# Patient Record
Sex: Female | Born: 1945 | Race: White | Hispanic: No | Marital: Married | Smoking: Never smoker
Health system: Southern US, Community
[De-identification: ages and names within clinical notes are randomized; demographics above are authoritative.]

## PROBLEM LIST (undated history)

## (undated) DIAGNOSIS — K449 Diaphragmatic hernia without obstruction or gangrene: Secondary | ICD-10-CM

## (undated) DIAGNOSIS — K648 Other hemorrhoids: Secondary | ICD-10-CM

## (undated) DIAGNOSIS — R42 Dizziness and giddiness: Secondary | ICD-10-CM

## (undated) DIAGNOSIS — D369 Benign neoplasm, unspecified site: Secondary | ICD-10-CM

## (undated) DIAGNOSIS — K644 Residual hemorrhoidal skin tags: Secondary | ICD-10-CM

## (undated) DIAGNOSIS — M1990 Unspecified osteoarthritis, unspecified site: Secondary | ICD-10-CM

## (undated) DIAGNOSIS — IMO0001 Reserved for inherently not codable concepts without codable children: Secondary | ICD-10-CM

## (undated) DIAGNOSIS — D649 Anemia, unspecified: Secondary | ICD-10-CM

## (undated) DIAGNOSIS — G2581 Restless legs syndrome: Secondary | ICD-10-CM

## (undated) DIAGNOSIS — K219 Gastro-esophageal reflux disease without esophagitis: Secondary | ICD-10-CM

## (undated) DIAGNOSIS — K7689 Other specified diseases of liver: Secondary | ICD-10-CM

## (undated) DIAGNOSIS — G43909 Migraine, unspecified, not intractable, without status migrainosus: Secondary | ICD-10-CM

## (undated) DIAGNOSIS — K55039 Acute (reversible) ischemia of large intestine, extent unspecified: Secondary | ICD-10-CM

## (undated) DIAGNOSIS — E7800 Pure hypercholesterolemia, unspecified: Secondary | ICD-10-CM

## (undated) HISTORY — DX: Acute (reversible) ischemia of large intestine, extent unspecified: K55.039

## (undated) HISTORY — PX: CATARACT EXTRACTION W/ INTRAOCULAR LENS IMPLANT: SHX1309

## (undated) HISTORY — DX: Other specified diseases of liver: K76.89

## (undated) HISTORY — DX: Gastro-esophageal reflux disease without esophagitis: K21.9

## (undated) HISTORY — PX: ABDOMINAL HYSTERECTOMY: SHX81

## (undated) HISTORY — DX: Migraine, unspecified, not intractable, without status migrainosus: G43.909

## (undated) HISTORY — DX: Other hemorrhoids: K64.8

## (undated) HISTORY — PX: OTHER SURGICAL HISTORY: SHX169

## (undated) HISTORY — DX: Diaphragmatic hernia without obstruction or gangrene: K44.9

## (undated) HISTORY — DX: Benign neoplasm, unspecified site: D36.9

## (undated) HISTORY — DX: Residual hemorrhoidal skin tags: K64.4

## (undated) HISTORY — PX: COLONOSCOPY W/ BIOPSIES: SHX1374

## (undated) HISTORY — DX: Pure hypercholesterolemia, unspecified: E78.00

## (undated) HISTORY — DX: Dizziness and giddiness: R42

---

## 1991-12-13 HISTORY — PX: TOTAL ABDOMINAL HYSTERECTOMY W/ BILATERAL SALPINGOOPHORECTOMY: SHX83

## 1998-12-12 HISTORY — PX: KNEE ARTHROSCOPY: SHX127

## 1999-02-25 ENCOUNTER — Encounter (INDEPENDENT_AMBULATORY_CARE_PROVIDER_SITE_OTHER): Payer: Self-pay | Admitting: Internal Medicine

## 1999-02-25 ENCOUNTER — Ambulatory Visit (HOSPITAL_COMMUNITY): Admission: RE | Admit: 1999-02-25 | Discharge: 1999-02-25 | Payer: Self-pay | Admitting: Internal Medicine

## 2003-11-10 ENCOUNTER — Other Ambulatory Visit: Admission: RE | Admit: 2003-11-10 | Discharge: 2003-11-10 | Payer: Self-pay | Admitting: Gynecology

## 2005-11-14 ENCOUNTER — Ambulatory Visit (HOSPITAL_BASED_OUTPATIENT_CLINIC_OR_DEPARTMENT_OTHER): Admission: RE | Admit: 2005-11-14 | Discharge: 2005-11-14 | Payer: Self-pay | Admitting: Specialist

## 2005-11-14 ENCOUNTER — Ambulatory Visit (HOSPITAL_COMMUNITY): Admission: RE | Admit: 2005-11-14 | Discharge: 2005-11-14 | Payer: Self-pay | Admitting: Specialist

## 2008-03-26 ENCOUNTER — Ambulatory Visit: Payer: Self-pay | Admitting: Internal Medicine

## 2008-04-01 ENCOUNTER — Encounter: Payer: Self-pay | Admitting: Internal Medicine

## 2008-04-01 ENCOUNTER — Ambulatory Visit: Payer: Self-pay | Admitting: Internal Medicine

## 2011-04-29 NOTE — Op Note (Signed)
NAMEGRAZIA, TAFFE                ACCOUNT NO.:  [id number]   MEDICAL RECORD NO.:  [id number]          PATIENT TYPE:  AMB   LOCATION:  [facility]                         FACILITY:  [facility]   PHYSICIAN:  Jene Every, M.D.    DATE OF BIRTH:  01-06-1946   DATE OF PROCEDURE:  11/14/2005  DATE OF DISCHARGE:                                 OPERATIVE REPORT   PREOPERATIVE DIAGNOSIS:  Lateral meniscal tear, left knee.   POSTOPERATIVE DIAGNOSIS:  Lateral meniscal tear, left knee, patellofemoral  degenerative joint disease, tricompartmental osteoarthrosis, grade III  chondromalacia of the tibia.   PROCEDURE PERFORMED:  Left knee arthroscopy, chondroplasty, lateral femoral  condyle, lateral tibial plateau, patella, partial lateral meniscectomy,  evacuation of loose bodies.   ANESTHESIA:  General.   ASSISTANT:  None.   BRIEF HISTORY/INDICATIONS:  A 65 year old with lateral joint pain,  occasional giving away and locking of the knee.  Refractory to conservative  treatment, including corticosteroid injection, rest, activity modification,  MRI indicating an oblique tear to the anterior torn, lateral meniscus,  moderate osteoarthrosis of the lateral and the posterior femoral  compartments.  Joint effusion was noted.  Operative intervention was  indicated for diagnosis and debridement, evacuation of suspected loose  bodies, meniscus tear, chondral flap tear.  Risks and benefits were  discussed, including bleeding, infection, damage to vascular structures, no  change in symptoms, worsening symptoms, need for repeat debridement and for  total knee arthroplasty in the future, etc.   TECHNIQUE:  With the patient in supine position, after the induction of  adequate general anesthesia and 1 gm of Kefzol, the left lower extremity was  prepped and draped in the usual sterile fashion.  A lateral parapatellar  portal and superomedial parapatellar portal was fashioned with a #11 blade.  The ingress cannula  was atraumatically placed.  Irrigant was utilized to  insufflate the joint.  Under direct visualization, the medial parapatellar  portal was fashioned with a #11 blade after a localization with an 18 gauge  needle, sparing the medial meniscus.  Noted was extensive degenerative  changes of the patellofemoral joint as well as the medial compartment.  There was degenerative tearing of the meniscus, most on the anterior horn.  There was delamination or grade 3 chondromalacia of the femoral condyle but  mostly the tibial plateau.  There are some grade 4 lesions posteriorly as  well as on the patella and lateral facet.  I introduced a 4.2 coude shaver  and utilized to evacuate the loose bodies, performed a chondroplasty of the  tibial plateau, femoral condyle, partial lateral meniscectomy, particularly  the anterior horn.  Also, the posterior horn.  The remainder was stable to  probe palpation.  On the cephalad and caudad edge of it, I probed it  extensively with a probe.  Nothing subluxed into the joint or with flexion  and extension caused any impingement.  The patellofemoral joint revealed  good space for the patella.  There was normal tracking into the patella with  flexion and extension.  There was significant patellofemoral arthrosis  noted.  There was osteophytic  ridging over the lateral femoral condyle and  the medial femoral condyle.  Some degenerative fraying of the ACL was noted  as well.  I performed a chondroplasty of the patella and femoral sulcus.  Examined the gutters.  There was loose cartilaginous debris noted.  This was  evacuated.  No meniscus tear noted here.  The medial compartment revealed  some degenerative changes.  I gently debrided the medial compartment.  The  meniscus was stable to probe palpation without evidence of tear. The femoral  condyle showed some grade II and grade III changes.  Debridement was  performed here.  No loose bodies.  The medial gutter was  unremarkable.  I  __________ some __________ synovitis and some degenerative fraying of the  mid portion of the notch.  Copiously lavaged the knee.  PCL was not  visualized.  Again examined down the lateral compartment where the majority  of the symptoms were.  There was no residual chondral flap tear or meniscus  tear that was unstable.  Again, extensive degenerative changes were noted,  particularly on the tibial plateau.  Copiously lavaged and reprobed the  meniscus.  No residual pathology amenable to surgical intervention.  Reprobed it again.  It was not subluxing.  The remainder of the meniscus was  stable to probe palpation.  Next, the knee was copiously lavaged.  Portals  were closed with 4-0 nylon simple sutures.  Flexed and extended the knee.  There was good tracking of the patella.  Performed a McMurray.  Varus and  valgus stress was 0-30 degrees.  I elicited no blocks to range of motion.  No subluxation of the patella.  I felt it was without mechanical symptoms at  its conclusion.  The portals had been closed with 4-0 nylon simple suture.  I placed a 0.25% Marcaine with epinephrine and infiltrated the joint.  The  wound was dressed sterilely with 4x4s and Ace.  She was then awakened  without difficulty and transported to the recovery room in satisfactory  condition.   Patient tolerated the procedure well with no complications.      Jene Every, M.D.  Electronically Signed     JB/MEDQ  D:  11/14/2005  T:  11/14/2005  Job:  [id number]

## 2011-05-13 DIAGNOSIS — K55039 Acute (reversible) ischemia of large intestine, extent unspecified: Secondary | ICD-10-CM

## 2011-05-13 HISTORY — DX: Acute (reversible) ischemia of large intestine, extent unspecified: K55.039

## 2011-07-12 ENCOUNTER — Telehealth: Payer: Self-pay | Admitting: Internal Medicine

## 2011-07-12 NOTE — Telephone Encounter (Signed)
Forwarded to Dr. Gessner for Review. °

## 2011-07-20 ENCOUNTER — Encounter: Payer: Self-pay | Admitting: Internal Medicine

## 2011-07-20 ENCOUNTER — Telehealth: Payer: Self-pay | Admitting: Gastroenterology

## 2011-07-20 NOTE — Telephone Encounter (Signed)
Message copied by Bernita Buffy on Wed Jul 20, 2011  2:09 PM ------      Message from: Iva Boop      Created: Tue Jul 19, 2011  9:39 PM      Regarding: appt/       i have records from recent hospitalization that indicate she is to have an appointment with [place] but she does not have an appointment that i can tell            It is ok for her to schedule one.

## 2011-07-20 NOTE — Telephone Encounter (Signed)
Appointment scheduled.

## 2011-08-23 ENCOUNTER — Encounter: Payer: Self-pay | Admitting: Internal Medicine

## 2011-08-23 ENCOUNTER — Ambulatory Visit (INDEPENDENT_AMBULATORY_CARE_PROVIDER_SITE_OTHER): Payer: Medicare Other | Admitting: Internal Medicine

## 2011-08-23 VITALS — BP 122/80 | HR 60 | Ht 67.0 in | Wt 157.0 lb

## 2011-08-23 DIAGNOSIS — Z860100 Personal history of colon polyps, unspecified: Secondary | ICD-10-CM | POA: Insufficient documentation

## 2011-08-23 DIAGNOSIS — K55059 Acute (reversible) ischemia of intestine, part and extent unspecified: Secondary | ICD-10-CM

## 2011-08-23 DIAGNOSIS — K55039 Acute (reversible) ischemia of large intestine, extent unspecified: Secondary | ICD-10-CM | POA: Insufficient documentation

## 2011-08-23 NOTE — Progress Notes (Signed)
  Subjective:    Patient ID: Beth Suarez, female    DOB: 26-Jun-1946, 65 y.o.   MRN: [id number]  HPI Very pleasant 65 year old white woman that was admitted to [facility] in June. CT scan colonoscopy and biopsies showed that she had left-sided ischemic colitis. At that time she has returned to normal and has no complaints. She is here for followup to see if anything further needs to be done. I know her from a routine screening colonoscopy in 2009, she had 2 small adenomas removed.   Review of Systems Intermittent migraine headaches Otherwise negative    Objective:   Physical Exam General: WDWN NAD Eyes: anicteric Lungs: clear Heart: S1S2 no rubs, murmurs or gallops Abdomen: soft and nontender, BS+ Ext: no edema          Assessment & Plan:

## 2011-08-23 NOTE — Patient Instructions (Signed)
We will plan to see you for a routine Colonoscopy approximately April 2014.

## 2011-08-23 NOTE — Assessment & Plan Note (Signed)
She appears recovered. I explained the physiology and  causes of ischemic colitis. She does not appear to have any risk factors for that to him aware of. The major mesenteric vessels were patent on CT scan. We'll observe for any recurrent problems, would not do any further workup at this point. She had just a trivial anemia with hemoglobin 11.9. Can followup with Dr. Sherril Croon on that.

## 2011-08-23 NOTE — Assessment & Plan Note (Signed)
Anticipate routine repeat colonoscopy in April 2014. The colonoscopy she had this chair and is not an adequate screening and surveillance exam due to the colitis.

## 2013-03-28 ENCOUNTER — Encounter: Payer: Self-pay | Admitting: Internal Medicine

## 2013-04-03 ENCOUNTER — Telehealth: Payer: Self-pay | Admitting: Internal Medicine

## 2013-04-04 NOTE — Telephone Encounter (Signed)
I have left a detailed message for the patient that Dr. Leone Payor has reviewed her chart and she is infact due for a colonoscopy.  I have asked her to call back if she has any additional questions or concerns to me or call back to schedule.

## 2013-10-15 ENCOUNTER — Encounter: Payer: Self-pay | Admitting: Internal Medicine

## 2015-03-12 ENCOUNTER — Encounter: Payer: Self-pay | Admitting: Internal Medicine

## 2015-04-21 ENCOUNTER — Encounter: Payer: Self-pay | Admitting: *Deleted

## 2015-04-22 ENCOUNTER — Encounter: Payer: Self-pay | Admitting: *Deleted

## 2015-04-22 ENCOUNTER — Ambulatory Visit (INDEPENDENT_AMBULATORY_CARE_PROVIDER_SITE_OTHER): Payer: Medicare Other | Admitting: Cardiology

## 2015-04-22 ENCOUNTER — Encounter: Payer: Self-pay | Admitting: Cardiology

## 2015-04-22 VITALS — BP 113/76 | HR 65 | Ht 67.0 in | Wt 165.0 lb

## 2015-04-22 DIAGNOSIS — E782 Mixed hyperlipidemia: Secondary | ICD-10-CM

## 2015-04-22 DIAGNOSIS — R079 Chest pain, unspecified: Secondary | ICD-10-CM

## 2015-04-22 DIAGNOSIS — R0602 Shortness of breath: Secondary | ICD-10-CM

## 2015-04-22 DIAGNOSIS — D509 Iron deficiency anemia, unspecified: Secondary | ICD-10-CM

## 2015-04-22 NOTE — Patient Instructions (Signed)
Your physician recommends that you continue on your current medications as directed. Please refer to the Current Medication list given to you today. Your physician has requested that you have en exercise stress myoview. For further information please visit [email or web address]. Please follow instruction sheet, as given.  We will call you with your results. 

## 2015-04-22 NOTE — Progress Notes (Signed)
Cardiology Office Note  Date: 04/22/2015   ID: Beth Suarez, DOB October 02, 1946, MRN [id number]  PCP: Glenda Chroman., MD  Primary Cardiologist: Rozann Lesches, MD   Chief Complaint  Patient presents with  . Chest Pain    History of Present Illness: Beth Suarez is a 69 y.o. female referred for cardiology consultation by Dr. Woody Seller. I reviewed her records and discussed her symptoms. She states that back in March she had a dark tarry stool, only on one occasion. After this she indicates experiencing exertional chest tightness concerning for angina. It occurred at one point when she was out picking up sticks in her yard, and again when she went to the track to walk. Usually she walked a mile with her husband, but was only able to go one lap that particular day due to chest tightness. She was admitted to [facility] by Dr. Woody Seller and was found to be anemic with a hemoglobin as low as 7.7, iron level of 20, iron saturation of only 4%. ECG showed sinus rhythm with nonspecific T-wave changes. Troponin I levels were normal and chest x-ray reported no acute findings.   She tells me that she was treated with iron infusions, is now on an oral iron supplement. Most recent follow-up hemoglobin was up to 10.9 in late April as shown below. She awaits gastroenterology consultation with Dr. Carlean Purl (seen by him in 2012).  Beth Suarez reports that the episodes of chest tightness have resolved, however she still experiences exertional fatigue and more shortness of breath with activity than she is used to. She does not endorse any prior history of ischemic heart disease or cardiomyopathy. Past medical history reviewed including hyperlipidemia and reportedly ischemic colitis back in 2012.   Past Medical History  Diagnosis Date  . Vertigo   . GERD (gastroesophageal reflux disease)   . Hypercholesteremia   . Migraine headache   . Adenomatous polyps     Colonoscopy 03/2008  . Internal and external hemorrhoids  without complication   . Liver cyst   . Hiatal hernia   . Acute ischemic colitis 05/2011    [facility]    Past Surgical History  Procedure Laterality Date  . Knee arthroscopy Left   . Total abdominal hysterectomy w/ bilateral salpingoophorectomy  1993  . Colonoscopy w/ biopsies  03/2008, 05/2011    Adenomous polyps, internal and external hemorrhoids, colitis    Current Outpatient Prescriptions  Medication Sig Dispense Refill  . Ascorbic Acid (VITAMIN C) 1000 MG tablet Take 1,000 mg by mouth 2 (two) times daily.    [name] Kitchen atorvastatin (LIPITOR) 10 MG tablet Take 1 tablet by mouth daily.  6  . Biotin 10 MG TABS Take 1 tablet by mouth daily.    . Calcium-Vitamin D 600-125 MG-UNIT TABS Take 1 tablet by mouth 2 (two) times daily.     . Cholecalciferol (VITAMIN D3) 5000 UNITS CAPS Take 1 capsule by mouth daily.    . clonazePAM (KLONOPIN) 0.5 MG tablet Take 1 tablet by mouth daily as needed.  1  . Ferrous Gluconate (IRON) 240 (27 FE) MG TABS Take 1 tablet by mouth daily.    . Multiple Vitamin (MULTIVITAMIN) tablet Take 1 tablet by mouth daily.    . Omega-3 Fatty Acids (FISH OIL) 1000 MG CAPS Take 1 capsule by mouth daily.       No current facility-administered medications for this visit.    Allergies:  Codeine   Social History: The patient  reports that she has  never smoked. She has never used smokeless tobacco. She reports that she does not drink alcohol or use illicit drugs.   Family History: The patient's family history includes Diabetes in her mother; Heart attack (age of onset: 93) in her father; Heart disease in her father; Irritable bowel syndrome in her daughter; Kidney disease in her mother.   ROS:  Please see the history of present illness. Otherwise, complete review of systems is positive for exertional fatigue and shortness of breath. No recurring melena or hematochezia. All other systems are reviewed and negative.   Physical Exam: VS:  BP 113/76 mmHg  Pulse 65  Ht 5\' 7"   (1.702 m)  Wt 165 lb (74.844 kg)  BMI 25.84 kg/m2, BMI Body mass index is 25.84 kg/(m^2).  Wt Readings from Last 3 Encounters:  04/22/15 165 lb (74.844 kg)  08/23/11 157 lb (71.215 kg)     General: Patient appears comfortable at rest. HEENT: Conjunctiva and lids normal, oropharynx clear with moist mucosa. Neck: Supple, no elevated JVP or carotid bruits, no thyromegaly. Lungs: Clear to auscultation, nonlabored breathing at rest. Cardiac: Regular rate and rhythm, no S3 or significant systolic murmur, no pericardial rub. Abdomen: Soft, nontender, bowel sounds present, no guarding or rebound. Extremities: No pitting edema, distal pulses 2+. Skin: Warm and dry. Musculoskeletal: No kyphosis. Neuropsychiatric: Alert and oriented x3, affect grossly appropriate.   ECG: Tracing from 03/04/2015 reviewed showing sinus rhythm with nonspecific T-wave changes.   Recent Labwork:  04/06/2015: Hemoglobin 10.9, platelets 237, iron saturation 38%   Assessment and Plan:  1. Exertional fatigue and shortness of breath, prior to this exertional chest tightness consistent with angina. As described above, this was noted in these setting of significant iron deficiency anemia diagnosed in March at [facility] by Dr. Woody Seller. Hemoglobin has improved from a low of 7.7 up to 10.9, she still experiences exertional fatigue and shortness of breath. Gastroenterology evaluation is pending with Dr. Carlean Purl. From a cardiac perspective, we will proceed with an exercise Cardiolite to assess ischemic burden, exclude underlying obstructive CAD that was perhaps unmasked by anemia.  2. Hyperlipidemia, on omega-3 supplements and Lipitor, followed by Dr. Woody Seller.  3. Acute ischemic colitis 2012.  Current medicines were reviewed with the patient today.   Orders Placed This Encounter  Procedures  . NM Myocar Multi W/Spect W/Wall Motion / EF  . Myocardial Perfusion Imaging    Disposition: Call with results.   Signed, Satira Sark, MD, [facility] 04/22/2015 11:14 AM    [facility] [address], [place], [place] [address] Phone: [phone]; Fax: [phone]

## 2015-05-01 ENCOUNTER — Encounter (HOSPITAL_COMMUNITY): Payer: Self-pay

## 2015-05-01 ENCOUNTER — Encounter (HOSPITAL_COMMUNITY)
Admission: RE | Admit: 2015-05-01 | Discharge: 2015-05-01 | Disposition: A | Discharge status: Home / Self Care | Payer: Medicare Other | Source: Ambulatory Visit | Attending: Cardiology | Admitting: Cardiology

## 2015-05-01 ENCOUNTER — Inpatient Hospital Stay (HOSPITAL_COMMUNITY): Admission: RE | Admit: 2015-05-01 | Payer: Medicare Other | Source: Ambulatory Visit

## 2015-05-01 DIAGNOSIS — R079 Chest pain, unspecified: Secondary | ICD-10-CM | POA: Insufficient documentation

## 2015-05-01 DIAGNOSIS — R0602 Shortness of breath: Secondary | ICD-10-CM

## 2015-05-01 MED ORDER — SODIUM CHLORIDE 0.9 % IJ SOLN
INTRAMUSCULAR | Status: AC
  Administered 2015-05-01: 10 mL
  Filled 2015-05-01: qty 3

## 2015-05-01 MED ORDER — REGADENOSON 0.4 MG/5ML IV SOLN
INTRAVENOUS | Status: AC
  Filled 2015-05-01: qty 5

## 2015-05-01 MED ORDER — TECHNETIUM TC 99M SESTAMIBI - CARDIOLITE: 30.0000 | Freq: Once | INTRAVENOUS | Status: AC | PRN

## 2015-05-01 MED ORDER — TECHNETIUM TC 99M SESTAMIBI GENERIC - CARDIOLITE: 10.0000 | Freq: Once | INTRAVENOUS | Status: AC | PRN

## 2015-05-04 ENCOUNTER — Encounter: Payer: Self-pay | Admitting: Internal Medicine

## 2015-05-04 ENCOUNTER — Ambulatory Visit (INDEPENDENT_AMBULATORY_CARE_PROVIDER_SITE_OTHER): Payer: Medicare Other | Admitting: Internal Medicine

## 2015-05-04 VITALS — BP 108/70 | HR 76 | Ht 65.5 in | Wt 167.4 lb

## 2015-05-04 DIAGNOSIS — K449 Diaphragmatic hernia without obstruction or gangrene: Secondary | ICD-10-CM

## 2015-05-04 DIAGNOSIS — R1310 Dysphagia, unspecified: Secondary | ICD-10-CM | POA: Diagnosis not present

## 2015-05-04 DIAGNOSIS — D509 Iron deficiency anemia, unspecified: Secondary | ICD-10-CM | POA: Insufficient documentation

## 2015-05-04 NOTE — Progress Notes (Signed)
Subjective:    Patient ID: Beth Suarez, female    DOB: 08/02/1946, 69 y.o.   MRN: [id number] Chief complaint: Iron deficiency anemia HPI The patient is here for evaluation, she is a 69 year old white woman with a history of ischemic colitis and Adenomatous polyps in 2012. She was recently having dyspnea on exertion and fatigue and was found to be iron deficient with a hemoglobin of 8.1 and a ferritin of 20. She has been treated with iron infusions 2. There is been no sign of bleeding. She feels much better and her hemoglobin and iron studies have normalized. She remains on oral ferrous sulfate. GI systems turns up some intermittent dysphagia. Review of chart shows that showed a large hiatal hernia noted on a previous abdominal pelvic CT. Her GI review of systems is otherwise negative. She does have some intermittent chest pain issues. She has seen cardiology. The chest pain was exertional and probably associated with her anemia has a hemoglobin was around 8 at that time.  She would like to get an evaluation as soon as possible, she is not worried but just wants to get out of the way. She is taking her grandchildren to the [place] the week of June 12th. Allergies  Allergen Reactions  . Codeine     Headache   Outpatient Prescriptions Prior to Visit  Medication Sig Dispense Refill  . Ascorbic Acid (VITAMIN C) 1000 MG tablet Take 1,000 mg by mouth 2 (two) times daily.    [name] Kitchen atorvastatin (LIPITOR) 10 MG tablet Take 1 tablet by mouth daily.  6  . Biotin 10 MG TABS Take 1 tablet by mouth daily.    . Calcium-Vitamin D 600-125 MG-UNIT TABS Take 1 tablet by mouth 2 (two) times daily.     . Cholecalciferol (VITAMIN D3) 5000 UNITS CAPS Take 1 capsule by mouth daily.    . clonazePAM (KLONOPIN) 0.5 MG tablet Take 1 tablet by mouth daily as needed.  1  . Ferrous Gluconate (IRON) 240 (27 FE) MG TABS Take 1 tablet by mouth daily.    . Multiple Vitamin (MULTIVITAMIN) tablet Take 1 tablet by mouth daily.      . Omega-3 Fatty Acids (FISH OIL) 1000 MG CAPS Take 1 capsule by mouth daily.       No facility-administered medications prior to visit.   Past Medical History  Diagnosis Date  . Vertigo   . GERD (gastroesophageal reflux disease)   . Hypercholesteremia   . Migraine headache   . Adenomatous polyps     Colonoscopy 03/2008  . Internal and external hemorrhoids without complication   . Liver cyst   . Hiatal hernia   . Acute ischemic colitis 05/2011    [facility]   Past Surgical History  Procedure Laterality Date  . Knee arthroscopy Left 2000  . Total abdominal hysterectomy w/ bilateral salpingoophorectomy  1993  . Colonoscopy w/ biopsies  03/2008, 05/2011    Adenomous polyps, internal and external hemorrhoids, colitis  . Sebaceous cyst removeal Right      face   History   Social History  . Marital Status: Married    Spouse Name: N/A  . Number of Children: 2  . Years of Education: N/A   Occupational History  . Retired    Social History Main Topics  . Smoking status: Never Smoker   . Smokeless tobacco: Never Used  . Alcohol Use: No  . Drug Use: No  . Sexual Activity: Not on file   Other Topics  Concern  . None   Social History Narrative   Married, retired Optometrist 2 daughters she has grandchildren also   3 caffeinated beverages daily   Family History  Problem Relation Age of Onset  . Diabetes Mother   . Kidney disease Mother   . Heart attack Father 48  . Heart disease Father   . Irritable bowel syndrome Daughter      Review of Systems As per history of present illness    Objective:   Physical Exam @BP  108/70 mmHg  Pulse 76  Ht 5' 5.5" (1.664 m)  Wt 167 lb 6 oz (75.921 kg)  BMI 27.42 kg/m2@  General:  NAD Eyes:   anicteric Lungs:  clear Heart:: S1S2 no rubs, murmurs or gallops Neuro:  Alert and oriented     Data Reviewed:  As per history of present illness. Hemoglobin is now normal labs from May 19 will be scanned in.    Assessment &  Plan:   1. Anemia, iron deficiency   2. Hiatal hernia   3. Dysphagia    Cause of the anemia is not clear. One possibility that comes to mind is a large hiatal hernia with Cameron's erosions. This may be causing her dysphagia issue as well. Given that she has a history of polyps her last colonoscopy was in 2012 I think a colonoscopy along with an upper GI endoscopy are appropriate for evaluation of this anemia. We will try to schedule this in June. She will remain on her ferrous sulfate at this point.  The risks and benefits as well as alternatives of endoscopic procedure(s) have been discussed and reviewed. All questions answered. The patient agrees to proceed.  I appreciate the opportunity to care for this patient. CC: VYAS,DHRUV B., MD

## 2015-05-04 NOTE — Patient Instructions (Signed)
You have been scheduled for an endoscopy and colonoscopy. Please follow the written instructions given to you at your visit today. Please pick up your prep supplies at the pharmacy within the next 1-3 days. If you use inhalers (even only as needed), please bring them with you on the day of your procedure.  I appreciate the opportunity to care for you. Carl Gessner, MD, FACG 

## 2015-05-05 ENCOUNTER — Telehealth: Payer: Self-pay | Admitting: *Deleted

## 2015-05-05 ENCOUNTER — Encounter: Payer: Self-pay | Admitting: Cardiology

## 2015-05-05 NOTE — Telephone Encounter (Signed)
Patient informed. 

## 2015-05-05 NOTE — Telephone Encounter (Signed)
-----   Message from Satira Sark, MD sent at 05/05/2015  4:46 PM EDT ----- I was able to track down the completed report by Dr. Harl Bowie (dictated). Please let Ms. Manton know that her stress test was low risk without any obvious large ischemic areas to suggest significant obstructive CAD. At this point do not anticipate further cardiac evaluation unless her symptoms persist. She should continue with plans for GI evaluation as already under way.

## 2015-06-04 ENCOUNTER — Encounter (HOSPITAL_COMMUNITY): Payer: Self-pay | Admitting: *Deleted

## 2015-06-05 ENCOUNTER — Ambulatory Visit (HOSPITAL_COMMUNITY): Admission: RE | Admit: 2015-06-05 | Payer: Medicare Other | Source: Ambulatory Visit | Admitting: Internal Medicine

## 2015-06-05 ENCOUNTER — Telehealth: Payer: Self-pay

## 2015-06-05 DIAGNOSIS — D649 Anemia, unspecified: Secondary | ICD-10-CM

## 2015-06-05 HISTORY — DX: Reserved for inherently not codable concepts without codable children: IMO0001

## 2015-06-05 HISTORY — DX: Unspecified osteoarthritis, unspecified site: M19.90

## 2015-06-05 SURGERY — COLONOSCOPY WITH PROPOFOL
Anesthesia: Monitor Anesthesia Care

## 2015-06-05 NOTE — Telephone Encounter (Signed)
-----   Message from Gatha Mayer, MD sent at 06/05/2015  4:57 AM EDT ----- Regarding: aborted procedure due to vomiting Will need to sort hx here - migraine vs difficulty w/ prep or both so i can figure out how to retry this  Thanks ----- Message -----    From: Jerene Bears, MD    Sent: 06/04/2015  10:44 PM      To: Marlon Pel, RN, Gatha Mayer, MD  Contacted by pt's daughter Scheduled for outpt colon with Carlean Purl tomorrow at [facility] Severe migraine with nausea and vomiting Unable to prep. Daughter planning to bring her to ED for treatment of migraine and concern of dehydration Colonoscopy will need to be rescheduled JMP

## 2015-06-05 NOTE — Telephone Encounter (Signed)
Patient reports that she did not go to the ER last night.  She did have a HA prior to starting her prep yesterday.  She usually does have N&V with her migraines.  She is feeling better this am.  I have rescheduled her for 06/16/15 at [facility] 10:30.  I will mail her new instructions.  She is aware that I will call her back if Dr. Carlean Purl wants an alternate plan.    She stated that the prep was too sweet and contributed to her nausea last night.  She had mixed it with Gatorade G2, but still too sweet.  She is advised that she can try mixing it with Crystal Light or watering down the gatorade even further if she prefers.

## 2015-06-10 ENCOUNTER — Telehealth: Payer: Self-pay

## 2015-06-10 NOTE — Telephone Encounter (Signed)
It was brought to our attention that patient was not booked for a specific time to have her colon / EGD at [facility] ENDO unit on 06/16/15.  Spoke with Sharee Pimple in ENDO and she put her on for 12:30pm, arrive at 11:00AM.  Patient took her original paperwork and we went thru on the phone and she changed all the dates and times.  She verbalized understanding the changes and will call back with any questions.

## 2015-06-11 ENCOUNTER — Encounter (HOSPITAL_COMMUNITY): Payer: Self-pay | Admitting: *Deleted

## 2015-06-11 ENCOUNTER — Telehealth: Payer: Self-pay | Admitting: Internal Medicine

## 2015-06-11 NOTE — Telephone Encounter (Signed)
Yes to reglan

## 2015-06-11 NOTE — Telephone Encounter (Signed)
Would you like her to have rx for the reglan to use if needed with her prepping since she got sick last prep.  Please advise Sir, thank you.  It was mentioned in her prep instructions .

## 2015-06-12 MED ORDER — METOCLOPRAMIDE HCL 5 MG PO TABS: ORAL_TABLET | ORAL | Status: DC

## 2015-06-12 NOTE — Telephone Encounter (Signed)
Patient informed that reglan sent in.

## 2015-06-15 NOTE — Anesthesia Preprocedure Evaluation (Signed)
Anesthesia Evaluation  Patient identified by MRN, date of birth, ID band Patient awake    Reviewed: Allergy & Precautions, NPO status , Patient's Chart, lab work & pertinent test results  History of Anesthesia Complications Negative for: history of anesthetic complications  Airway Mallampati: II  TM Distance: >3 FB Neck ROM: Full    Dental no notable dental hx. (+) Dental Advisory Given   Pulmonary neg pulmonary ROS,  breath sounds clear to auscultation  Pulmonary exam normal       Cardiovascular negative cardio ROS Normal cardiovascular examRhythm:Regular Rate:Normal  Low risk stress test 05/05/15   Neuro/Psych  Headaches, Vertigo  negative psych ROS   GI/Hepatic Neg liver ROS, hiatal hernia, GERD-  Medicated and Controlled,  Endo/Other  negative endocrine ROS  Renal/GU negative Renal ROS  negative genitourinary   Musculoskeletal  (+) Arthritis -, Osteoarthritis,    Abdominal   Peds negative pediatric ROS (+)  Hematology  (+) anemia ,   Anesthesia Other Findings   Reproductive/Obstetrics negative OB ROS                             Anesthesia Physical Anesthesia Plan  ASA: II  Anesthesia Plan: MAC   Post-op Pain Management:    Induction: Intravenous  Airway Management Planned: Nasal Cannula  Additional Equipment:   Intra-op Plan:   Post-operative Plan:   Informed Consent: I have reviewed the patients History and Physical, chart, labs and discussed the procedure including the risks, benefits and alternatives for the proposed anesthesia with the patient or authorized representative who has indicated his/her understanding and acceptance.   Dental advisory given  Plan Discussed with: CRNA  Anesthesia Plan Comments:         Anesthesia Quick Evaluation

## 2015-06-16 ENCOUNTER — Encounter (HOSPITAL_COMMUNITY)
Admission: RE | Disposition: A | Discharge status: Home / Self Care | Payer: Self-pay | Source: Ambulatory Visit | Attending: Internal Medicine

## 2015-06-16 ENCOUNTER — Encounter (HOSPITAL_COMMUNITY): Payer: Self-pay | Admitting: Anesthesiology

## 2015-06-16 ENCOUNTER — Ambulatory Visit (HOSPITAL_COMMUNITY)
Admission: RE | Admit: 2015-06-16 | Discharge: 2015-06-16 | Disposition: A | Discharge status: Home / Self Care | Payer: Medicare Other | Source: Ambulatory Visit | Attending: Internal Medicine | Admitting: Internal Medicine

## 2015-06-16 ENCOUNTER — Ambulatory Visit (HOSPITAL_COMMUNITY): Payer: Medicare Other | Admitting: Anesthesiology

## 2015-06-16 DIAGNOSIS — G43909 Migraine, unspecified, not intractable, without status migrainosus: Secondary | ICD-10-CM | POA: Diagnosis not present

## 2015-06-16 DIAGNOSIS — Z791 Long term (current) use of non-steroidal anti-inflammatories (NSAID): Secondary | ICD-10-CM | POA: Insufficient documentation

## 2015-06-16 DIAGNOSIS — Z8719 Personal history of other diseases of the digestive system: Secondary | ICD-10-CM | POA: Insufficient documentation

## 2015-06-16 DIAGNOSIS — M1990 Unspecified osteoarthritis, unspecified site: Secondary | ICD-10-CM | POA: Diagnosis not present

## 2015-06-16 DIAGNOSIS — R079 Chest pain, unspecified: Secondary | ICD-10-CM | POA: Insufficient documentation

## 2015-06-16 DIAGNOSIS — E780 Pure hypercholesterolemia: Secondary | ICD-10-CM | POA: Diagnosis not present

## 2015-06-16 DIAGNOSIS — K219 Gastro-esophageal reflux disease without esophagitis: Secondary | ICD-10-CM | POA: Insufficient documentation

## 2015-06-16 DIAGNOSIS — D509 Iron deficiency anemia, unspecified: Secondary | ICD-10-CM | POA: Diagnosis not present

## 2015-06-16 DIAGNOSIS — G2581 Restless legs syndrome: Secondary | ICD-10-CM | POA: Diagnosis not present

## 2015-06-16 DIAGNOSIS — K648 Other hemorrhoids: Secondary | ICD-10-CM | POA: Insufficient documentation

## 2015-06-16 DIAGNOSIS — Z79899 Other long term (current) drug therapy: Secondary | ICD-10-CM | POA: Insufficient documentation

## 2015-06-16 DIAGNOSIS — Z86010 Personal history of colonic polyps: Secondary | ICD-10-CM | POA: Insufficient documentation

## 2015-06-16 DIAGNOSIS — K449 Diaphragmatic hernia without obstruction or gangrene: Secondary | ICD-10-CM | POA: Insufficient documentation

## 2015-06-16 DIAGNOSIS — R1310 Dysphagia, unspecified: Secondary | ICD-10-CM

## 2015-06-16 HISTORY — PX: ESOPHAGOGASTRODUODENOSCOPY (EGD) WITH PROPOFOL: SHX5813

## 2015-06-16 HISTORY — DX: Anemia, unspecified: D64.9

## 2015-06-16 HISTORY — DX: Restless legs syndrome: G25.81

## 2015-06-16 HISTORY — PX: COLONOSCOPY WITH PROPOFOL: SHX5780

## 2015-06-16 SURGERY — ESOPHAGOGASTRODUODENOSCOPY (EGD) WITH PROPOFOL
Anesthesia: Monitor Anesthesia Care

## 2015-06-16 MED ORDER — SODIUM CHLORIDE 0.9 % IV SOLN: INTRAVENOUS | Status: DC

## 2015-06-16 MED ORDER — PROPOFOL INFUSION 10 MG/ML OPTIME
INTRAVENOUS | Status: DC | PRN
  Administered 2015-06-16: 100 ug/kg/min via INTRAVENOUS

## 2015-06-16 MED ORDER — PROPOFOL 10 MG/ML IV BOLUS
INTRAVENOUS | Status: DC | PRN
  Administered 2015-06-16: 50 mg via INTRAVENOUS
  Administered 2015-06-16 (×2): 20 mg via INTRAVENOUS

## 2015-06-16 MED ORDER — PROPOFOL 10 MG/ML IV BOLUS
INTRAVENOUS | Status: AC
  Filled 2015-06-16: qty 20

## 2015-06-16 MED ORDER — LACTATED RINGERS IV SOLN
INTRAVENOUS | Status: DC
  Administered 2015-06-16: 1000 mL via INTRAVENOUS

## 2015-06-16 SURGICAL SUPPLY — 24 items

## 2015-06-16 NOTE — Op Note (Signed)
[facility] [place], [address]   COLONOSCOPY PROCEDURE REPORT  PATIENT: Beth Suarez, Beth Suarez  MR#: [id number] BIRTHDATE: [birth year] , 20  yrs. old GENDER: female ENDOSCOPIST: Gatha Mayer, MD, [facility] PROCEDURE DATE:  06/16/2015 PROCEDURE:   Colonoscopy, diagnostic First Screening Colonoscopy - Avg.  risk and is 50 yrs.  old or older - No.  Prior Negative Screening - Now for repeat screening. N/A  History of Adenoma - Now for follow-up colonoscopy & has been > or = to 3 yrs.  N/A  Polyps removed today? No Recommend repeat exam, <10 yrs? No ASA CLASS:   Class II INDICATIONS:iron deficiency anemia. MEDICATIONS: Residual sedation present, Monitored anesthesia care, and Per Anesthesia  DESCRIPTION OF PROCEDURE:   After the risks benefits and alternatives of the procedure were thoroughly explained, informed consent was obtained.  The digital rectal exam revealed no abnormalities of the rectum.   The Pentax Ped Colon [id number] endoscope was introduced through the anus and advanced to the cecum, which was identified by both the appendix and ileocecal valve. No adverse events experienced.   The quality of the prep was excellent.  (MiraLax was used)  The instrument was then slowly withdrawn as the colon was fully examined. Estimated blood loss is zero unless otherwise noted in this procedure report.      COLON FINDINGS: Internal hemorrhoids were found.   The examination was otherwise normal.  Retroflexed views revealed internal hemorrhoids. The time to cecum = 5.5 Withdrawal time = 7.8   The scope was withdrawn and the procedure completed. COMPLICATIONS: There were no immediate complications.  ENDOSCOPIC IMPRESSION: 1.   Internal hemorrhoids 2.   The examination was otherwise normal  RECOMMENDATIONS: 1.  Continue ferrous sulfate 2.  My office will schedule upper GI series to evaluate hiatal hernia and dysphagia  eSigned:  Gatha Mayer, MD, [facility]  06/16/2015 12:30 PM   cc: The Patient and Jerene Bears, MD

## 2015-06-16 NOTE — Anesthesia Postprocedure Evaluation (Signed)
  Anesthesia Post-op Note  Patient: Beth Suarez  Procedure(s) Performed: Procedure(s) (LRB): ESOPHAGOGASTRODUODENOSCOPY (EGD) WITH PROPOFOL (N/A) COLONOSCOPY WITH PROPOFOL (N/A)  Patient Location: PACU  Anesthesia Type: MAC  Level of Consciousness: awake and alert   Airway and Oxygen Therapy: Patient Spontanous Breathing  Post-op Pain: mild  Post-op Assessment: Post-op Vital signs reviewed, Patient's Cardiovascular Status Stable, Respiratory Function Stable, Patent Airway and No signs of Nausea or vomiting  Last Vitals:  Filed Vitals:   06/16/15 1230  BP: 98/59  Pulse: 58  Temp:   Resp: 15    Post-op Vital Signs: stable   Complications: No apparent anesthesia complications

## 2015-06-16 NOTE — Discharge Instructions (Addendum)
° °  I found a large hiatal hernia - stomach moves from abdomen into chest. This could be source of problems. My office will arrange a test called an upper GI series to evaluate this further. Sometimes it reveals things that make me recommend having it fixed by surgery.  The colon was ok - hemorrhoids - no polyps. ? Repeat one in 10 years but will be 78 so maybe not.  I appreciate the opportunity to care for you. Gatha Mayer, MD, FACG  YOU HAD AN ENDOSCOPIC PROCEDURE TODAY: Refer to the procedure report and other information in the discharge instructions given to you for any specific questions about what was found during the examination. If this information does not answer your questions, please call Dr. Celesta Aver office at [phone] to clarify.   YOU SHOULD EXPECT: Some feelings of bloating in the abdomen. Passage of more gas than usual. Walking can help get rid of the air that was put into your GI tract during the procedure and reduce the bloating. If you had a lower endoscopy (such as a colonoscopy or flexible sigmoidoscopy) you may notice spotting of blood in your stool or on the toilet paper. Some abdominal soreness may be present for a day or two, also.  DIET: Your first meal following the procedure should be a light meal and then it is ok to progress to your normal diet. A half-sandwich or bowl of soup is an example of a good first meal. Heavy or fried foods are harder to digest and may make you feel nauseous or bloated. Drink plenty of fluids but you should avoid alcoholic beverages for 24 hours.   ACTIVITY: Your care partner should take you home directly after the procedure. You should plan to take it easy, moving slowly for the rest of the day. You can resume normal activity the day after the procedure however YOU SHOULD NOT DRIVE, use power tools, machinery or perform tasks that involve climbing or major physical exertion for 24 hours (because of the sedation medicines used during  the test).   SYMPTOMS TO REPORT IMMEDIATELY: A gastroenterologist can be reached at any hour. Please call [phone]  for any of the following symptoms:  Following lower endoscopy (colonoscopy, flexible sigmoidoscopy) Excessive amounts of blood in the stool  Significant tenderness, worsening of abdominal pains  Swelling of the abdomen that is new, acute  Fever of 100 or higher  Following upper endoscopy (EGD, EUS, ERCP, esophageal dilation) Vomiting of blood or coffee ground material  New, significant abdominal pain  New, significant chest pain or pain under the shoulder blades  Painful or persistently difficult swallowing  New shortness of breath  Black, tarry-looking or red, bloody stools  FOLLOW UP:  If any biopsies were taken you will be contacted by phone or by letter within the next 1-3 weeks. Call [phone]  if you have not heard about the biopsies in 3 weeks.  Please also call with any specific questions about appointments or follow up tests.

## 2015-06-16 NOTE — Transfer of Care (Signed)
Immediate Anesthesia Transfer of Care Note  Patient: Beth Suarez  Procedure(s) Performed: Procedure(s): ESOPHAGOGASTRODUODENOSCOPY (EGD) WITH PROPOFOL (N/A) COLONOSCOPY WITH PROPOFOL (N/A)  Patient Location: PACU  Anesthesia Type:MAC  Level of Consciousness:  sedated, patient cooperative and responds to stimulation  Airway & Oxygen Therapy:Patient Spontanous Breathing and Patient connected to face mask oxgen  Post-op Assessment:  Report given to PACU RN and Post -op Vital signs reviewed and stable  Post vital signs:  Reviewed and stable  Last Vitals:  Filed Vitals:   06/16/15 1136  BP: 145/84  Pulse:   Temp:   Resp:     Complications: No apparent anesthesia complications

## 2015-06-16 NOTE — H&P (Signed)
[facility] History and Physical   Primary Care Physician:  Glenda Chroman., MD   Reason for Procedure:   evaluate iron deficiency anemia  Plan:    EGD and colonoscopy The risks and benefits as well as alternatives of endoscopic procedure(s) have been discussed and reviewed. All questions answered. The patient agrees to proceed.   HPI: Beth Suarez is a 70 y.o. female with iron deficiency anemia. Seen in May - HPI from that note  Subjective:    Patient ID: Beth Suarez, female DOB: 22-Jun-1946, 69 y.o. MRN: [id number] Chief complaint: Iron deficiency anemia HPI The patient is here for evaluation, she is a 69 year old white woman with a history of ischemic colitis and Adenomatous polyps in 2012. She was recently having dyspnea on exertion and fatigue and was found to be iron deficient with a hemoglobin of 8.1 and a ferritin of 20. She has been treated with iron infusions 2. There is been no sign of bleeding. She feels much better and her hemoglobin and iron studies have normalized. She remains on oral ferrous sulfate. GI systems turns up some intermittent dysphagia. Review of chart shows that showed a large hiatal hernia noted on a previous abdominal pelvic CT. Her GI review of systems is otherwise negative. She does have some intermittent chest pain issues. She has seen cardiology. The chest pain was exertional and probably associated with her anemia has a hemoglobin was around 8 at that time.          Past Medical History  Diagnosis Date  . Vertigo   . Hypercholesteremia   . Migraine headache   . Adenomatous polyps     Colonoscopy 03/2008  . Internal and external hemorrhoids without complication   . Liver cyst   . Hiatal hernia   . Acute ischemic colitis 05/2011    [facility]  . GERD (gastroesophageal reflux disease)     Not taking Medications  . Arthritis   . Shortness of breath dyspnea     Symptom with low Iron, symptom improved after iron  infusions 3'16  . Restless leg syndrome     more left leg involved  . Anemia     iron deficiency    Past Surgical History  Procedure Laterality Date  . Knee arthroscopy Left 2000  . Total abdominal hysterectomy w/ bilateral salpingoophorectomy  1993  . Colonoscopy w/ biopsies  03/2008, 05/2011    Adenomous polyps, internal and external hemorrhoids, colitis  . Sebaceous cyst removeal Right      face  . Abdominal hysterectomy      Prior to Admission medications   Medication Sig Start Date End Date Taking? Authorizing Provider  Ascorbic Acid (VITAMIN C) 1000 MG tablet Take 2,000 mg by mouth daily.    Yes Historical Provider, MD  atorvastatin (LIPITOR) 10 MG tablet Take 1 tablet by mouth daily. 03/31/15  Yes Historical Provider, MD  Biotin 10 MG TABS Take 1 tablet by mouth daily.   Yes Historical Provider, MD  Calcium-Vitamin D 600-125 MG-UNIT TABS Take 2 tablets by mouth daily.    Yes Historical Provider, MD  Cholecalciferol (VITAMIN D3) 5000 UNITS CAPS Take 1 capsule by mouth daily.   Yes Historical Provider, MD  clonazePAM (KLONOPIN) 0.5 MG tablet Take 1 tablet by mouth daily as needed (restless legs).  03/18/15  Yes Historical Provider, MD  Ferrous Gluconate (IRON) 240 (27 FE) MG TABS Take 1 tablet by mouth daily.   Yes Historical Provider, MD  ibuprofen (ADVIL,MOTRIN) 200 MG  tablet Take 200 mg by mouth every 6 (six) hours as needed for mild pain or moderate pain.   Yes Historical Provider, MD  ketotifen (ALLERGY EYE DROPS) 0.025 % ophthalmic solution Place 1 drop into both eyes daily as needed (allergy).   Yes Historical Provider, MD  metoCLOPramide (REGLAN) 5 MG tablet Take as directed. Patient taking differently: Take 5 mg by mouth every 6 (six) hours as needed for nausea. Take as directed. 06/12/15  Yes Gatha Mayer, MD  Multiple Vitamin (MULTIVITAMIN) tablet Take 1 tablet by mouth daily.   Yes Historical Provider, MD  Omega-3 Fatty Acids (FISH OIL) 1000 MG CAPS Take 1 capsule by mouth  daily.     Yes Historical Provider, MD    No current facility-administered medications for this encounter.   Current Outpatient Prescriptions  Medication Sig Dispense Refill  . Ascorbic Acid (VITAMIN C) 1000 MG tablet Take 2,000 mg by mouth daily.     [name] Kitchen atorvastatin (LIPITOR) 10 MG tablet Take 1 tablet by mouth daily.  6  . Biotin 10 MG TABS Take 1 tablet by mouth daily.    . Calcium-Vitamin D 600-125 MG-UNIT TABS Take 2 tablets by mouth daily.     . Cholecalciferol (VITAMIN D3) 5000 UNITS CAPS Take 1 capsule by mouth daily.    . clonazePAM (KLONOPIN) 0.5 MG tablet Take 1 tablet by mouth daily as needed (restless legs).   1  . Ferrous Gluconate (IRON) 240 (27 FE) MG TABS Take 1 tablet by mouth daily.    [name] Kitchen ibuprofen (ADVIL,MOTRIN) 200 MG tablet Take 200 mg by mouth every 6 (six) hours as needed for mild pain or moderate pain.    [name] Kitchen ketotifen (ALLERGY EYE DROPS) 0.025 % ophthalmic solution Place 1 drop into both eyes daily as needed (allergy).    [name] Kitchen metoCLOPramide (REGLAN) 5 MG tablet Take as directed. (Patient taking differently: Take 5 mg by mouth every 6 (six) hours as needed for nausea. Take as directed.) 2 tablet 0  . Multiple Vitamin (MULTIVITAMIN) tablet Take 1 tablet by mouth daily.    . Omega-3 Fatty Acids (FISH OIL) 1000 MG CAPS Take 1 capsule by mouth daily.        Allergies as of 06/05/2015 - Review Complete 06/04/2015  Allergen Reaction Noted  . Codeine  08/23/2011    Family History  Problem Relation Age of Onset  . Diabetes Mother   . Kidney disease Mother   . Heart attack Father 15  . Heart disease Father   . Irritable bowel syndrome Daughter     History   Social History  . Marital Status: Married    Spouse Name: N/A  . Number of Children: 2  . Years of Education: N/A   Occupational History  . Retired    Social History Main Topics  . Smoking status: Never Smoker   . Smokeless tobacco: Never Used  . Alcohol Use: No  . Drug Use: No  . Sexual Activity: Not  on file   Other Topics Concern  . Not on file   Social History Narrative   Married, retired Optometrist 2 daughters she has grandchildren also   3 caffeinated beverages daily    Review of Systems:  All other review of systems negative except as mentioned in the HPI.  Physical Exam: Vital signs in last 24 hours:     General:   Alert,  Well-developed, well-nourished, pleasant and cooperative in NAD Lungs:  Clear throughout to auscultation.   Heart:  Regular  rate and rhythm; no murmurs, clicks, rubs,  or gallops. Abdomen:  Soft, nontender and nondistended. Normal bowel sounds.   Neuro/Psych:  Alert and cooperative. Normal mood and affect. A and O x 3   @Dayani Winbush  Simonne Maffucci, MD, [facility] [phone] (pager) 06/16/2015 11:14 AM@

## 2015-06-16 NOTE — Op Note (Signed)
[facility] [place], [address]   ENDOSCOPY PROCEDURE REPORT  PATIENT: Beth Suarez, Beth Suarez  MR#: [id number] BIRTHDATE: 1946-04-13 , 50  yrs. old GENDER: female ENDOSCOPIST: Gatha Mayer, MD, [facility] PROCEDURE DATE:  06/16/2015 PROCEDURE:  EGD, diagnostic ASA CLASS:     Class II INDICATIONS:  unexplained iron deficiency anemia and dysphagia. MEDICATIONS: Monitored anesthesia care and Per Anesthesia TOPICAL ANESTHETIC: none  DESCRIPTION OF PROCEDURE: After the risks benefits and alternatives of the procedure were thoroughly explained, informed consent was obtained.  The    endoscope was introduced through the mouth and advanced to the second portion of the duodenum , Without limitations.  The instrument was slowly withdrawn as the mucosa was fully examined.    1) 10 cm hiatal hernia - 30-40 cm 2) Otherwise normal EGD.  Retroflexed views revealed a hiatal hernia.     The scope was then withdrawn from the patient and the procedure completed.  COMPLICATIONS: There were no immediate complications.  ENDOSCOPIC IMPRESSION: 1) 10 cm hiatal hernia - 30-40 cm 2) Otherwise normal EGD   - no cause of anemia seen - did not see Cameron's erosions with hiatal hernia but they can be intermittent  RECOMMENDATIONS: Proceed with a Colonoscopy.   eSigned:  Gatha Mayer, MD, [facility] 06/16/2015 12:26 PM    CC:The Patient and Jerene Bears, MD

## 2015-06-17 ENCOUNTER — Encounter (HOSPITAL_COMMUNITY): Payer: Self-pay | Admitting: Internal Medicine

## 2015-06-22 ENCOUNTER — Telehealth: Payer: Self-pay

## 2015-06-22 ENCOUNTER — Other Ambulatory Visit: Payer: Self-pay

## 2015-06-22 DIAGNOSIS — R1314 Dysphagia, pharyngoesophageal phase: Secondary | ICD-10-CM

## 2015-06-22 NOTE — Telephone Encounter (Signed)
Pt scheduled for upper gi series at [facility] 06/26/15@9 :30am. Pt to arrive there at 9:15am. Pt to be NPO after midnight, pt aware of appt and instructions.

## 2015-06-26 ENCOUNTER — Ambulatory Visit (HOSPITAL_COMMUNITY)
Admission: RE | Admit: 2015-06-26 | Discharge: 2015-06-26 | Disposition: A | Discharge status: Home / Self Care | Payer: Medicare Other | Source: Ambulatory Visit | Attending: Internal Medicine | Admitting: Internal Medicine

## 2015-06-26 DIAGNOSIS — R1314 Dysphagia, pharyngoesophageal phase: Secondary | ICD-10-CM

## 2015-06-26 DIAGNOSIS — K219 Gastro-esophageal reflux disease without esophagitis: Secondary | ICD-10-CM | POA: Insufficient documentation

## 2015-06-26 DIAGNOSIS — K449 Diaphragmatic hernia without obstruction or gangrene: Secondary | ICD-10-CM | POA: Diagnosis not present

## 2015-06-26 DIAGNOSIS — R1310 Dysphagia, unspecified: Secondary | ICD-10-CM | POA: Diagnosis present

## 2015-06-28 NOTE — Progress Notes (Signed)
Quick Note:  Half of stomach is in chest - should consider having this repaired - will fix anemia and swallowing problems and prevent worsening of problems as she gets older I can discuss more with her over the phone next week but do think she needs a consult with a surgeon ______

## 2015-06-29 NOTE — Progress Notes (Signed)
Quick Note:  Ask her to make a f/u with me to review things - Sept/Oct ______

## 2015-08-21 ENCOUNTER — Encounter: Payer: Self-pay | Admitting: Internal Medicine

## 2015-08-21 ENCOUNTER — Other Ambulatory Visit (INDEPENDENT_AMBULATORY_CARE_PROVIDER_SITE_OTHER): Payer: Medicare Other

## 2015-08-21 ENCOUNTER — Ambulatory Visit (INDEPENDENT_AMBULATORY_CARE_PROVIDER_SITE_OTHER): Payer: Medicare Other | Admitting: Internal Medicine

## 2015-08-21 VITALS — BP 100/70 | HR 76 | Ht 65.5 in | Wt 168.2 lb

## 2015-08-21 DIAGNOSIS — D509 Iron deficiency anemia, unspecified: Secondary | ICD-10-CM

## 2015-08-21 DIAGNOSIS — K449 Diaphragmatic hernia without obstruction or gangrene: Secondary | ICD-10-CM | POA: Diagnosis not present

## 2015-08-21 LAB — CBC WITH DIFFERENTIAL/PLATELET
BASOS PCT: 1 % (ref 0.0–3.0)
Basophils Absolute: 0.1 10*3/uL (ref 0.0–0.1)
EOS PCT: 5.2 % — AB (ref 0.0–5.0)
Eosinophils Absolute: 0.3 10*3/uL (ref 0.0–0.7)
HEMATOCRIT: 34.7 % — AB (ref 36.0–46.0)
HEMOGLOBIN: 11.8 g/dL — AB (ref 12.0–15.0)
LYMPHS PCT: 31 % (ref 12.0–46.0)
Lymphs Abs: 1.8 10*3/uL (ref 0.7–4.0)
MCHC: 34.2 g/dL (ref 30.0–36.0)
MCV: 91.6 fl (ref 78.0–100.0)
MONO ABS: 0.6 10*3/uL (ref 0.1–1.0)
MONOS PCT: 9.9 % (ref 3.0–12.0)
Neutro Abs: 3.1 10*3/uL (ref 1.4–7.7)
Neutrophils Relative %: 52.9 % (ref 43.0–77.0)
Platelets: 281 10*3/uL (ref 150.0–400.0)
RBC: 3.78 Mil/uL — AB (ref 3.87–5.11)
RDW: 14.6 % (ref 11.5–15.5)
WBC: 5.8 10*3/uL (ref 4.0–10.5)

## 2015-08-21 LAB — FERRITIN: FERRITIN: 14 ng/mL (ref 10.0–291.0)

## 2015-08-21 NOTE — Assessment & Plan Note (Addendum)
Refer to surgery re: hiatal hernia I explained some of the basics of laparoscopic repair and gave a handout Think anemia probably related and would like to treat that and also reduce chances of future problems when she is older  If surgeon thinks manometry needed then can arrange that - will see what they think

## 2015-08-21 NOTE — Patient Instructions (Signed)
Your physician has requested that you go to the basement for the following lab work before leaving today: CBC/diff, Ferritin  Today you have been given information to read on laparoscopy correction of acid reflux.   You have been scheduled for an appointment with Dr Kaylyn Lim at [facility]. Your appointment is on 09/17/2015 at 9:00AM. Please arrive at 8:30AM for registration. Make certain to bring a list of current medications, including any over the counter medications or vitamins. Also bring your co-pay if you have one as well as your insurance cards. [facility] is located at [address]. Should you need to reschedule your appointment, please contact them at [phone].   I appreciate the opportunity to care for you. Silvano Rusk, MD, [facility]

## 2015-08-21 NOTE — Progress Notes (Signed)
   Subjective:    Patient ID: Beth Suarez, female    DOB: 03/14/1946, 69 y.o.   MRN: [id number] Cc: hiatal hernia, anemia   HPI Here for f/u - has huge hiatal hernia on EGD and UGI Has iron def anemia I think related to that Sxs =- intermittent "chest squeezing", no dysphagia at present Medications, allergies, past medical history, past surgical history, family history and social history are reviewed and updated in the EMR.  Review of Systems As above    Objective:   Physical Exam BP 100/70 mmHg  Pulse 76  Ht 5' 5.5" (1.664 m)  Wt 168 lb 4 oz (76.318 kg)  BMI 27.56 kg/m2     Assessment & Plan:  Hiatal hernia - large and symptomatic Refer to surgery re: hiatal hernia I explained some of the basics of laparoscopic repair and gave a handout Think anemia probably related and would like to treat that and also reduce chances of future problems when she is older  If surgeon thinks manometry needed then can arrange that - will see what they think    Anemia, iron deficiency CBC and ferritin   15 minutes time spent with patient > half in counseling coordination of care  I appreciate the opportunity to care for this patient. FE:OFHQ,RFXJO B., MD

## 2015-08-21 NOTE — Assessment & Plan Note (Signed)
CBC and ferritin 

## 2015-08-23 NOTE — Progress Notes (Signed)
Quick Note:  Hgb is almost normal Iron still low though Stay on ferrous gluconate but take twice a day please ______

## 2015-09-17 ENCOUNTER — Ambulatory Visit: Payer: Self-pay | Admitting: Surgery

## 2015-09-17 NOTE — H&P (Signed)
Beth Suarez 09/17/2015 9:12 AM Location: [facility] Patient #: [id number] DOB: 02/16/1946 Married / Language: English / Race: White Female  History of Present Illness Beth Suarez; 09/17/2015 10:19 AM) Patient words: Evaluate Hiatal Hernia.  The patient is a 69 year old female who presents with a hiatal hernia. Mr and Mrs. Siever recommended today to discuss her large hiatal hernia. I pulled up her upper GI series and showed it to them which shows he upper portion of her stomach is herniated above her diaphragm. She is had a hiatal hernia for some time along with symptoms of reflux. This is changed recently and now she has more problems with pressure in her chest. I discussed the etiology of her symptoms. She has really severe migraine headaches and does a lot of vomiting currently as a child which probably contributed to this. I described hiatal hernia repair along with Nissen fundoplication including the risk of recurrence, risk of issues related to that and the limitations of the procedure. She is eager to proceed. I gave her a booklet on this. I felt that informed consent was obtained. She is aware too of the postop diet.    Other Problems Ivor Costa, [place]; 09/17/2015 9:13 AM) Chest pain Gastroesophageal Reflux Disease Hypercholesterolemia Migraine Headache Oophorectomy  Past Surgical History Ivor Costa, [place]; 09/17/2015 9:13 AM) Hysterectomy (not due to cancer) - Complete Knee Surgery Left.  Diagnostic Studies History Ivor Costa, [place]; 09/17/2015 9:13 AM) Colonoscopy within last year Mammogram 1-3 years ago Pap Smear >5 years ago  Allergies Ivor Costa, [place]; 09/17/2015 9:14 AM) Codeine Phosphate *ANALGESICS - OPIOID*  Medication History Ivor Costa, CMA; 09/17/2015 9:36 AM) Vitamin C (Oral) Active. Lipitor (10MG  Tablet, Oral) Active. Biotin (1MG  Capsule, Oral) Active. Calcium D-Glucarate (500MG  Capsule, Oral)  Active. Vitamin D3 (2000UNIT Capsule, Oral) Active. KlonoPIN (0.5MG  Tablet, Oral) Active. Iron Caps CR (250MG  Capsule ER, Oral) Active. Omega 3-6-9 Fatty Acids (Oral) Active. Ketotifen Fumarate (0.025% Solution, Ophthalmic) Active. Medications Reconciled  Social History Ivor Costa, [place]; 09/17/2015 9:13 AM) Caffeine use Carbonated beverages, Tea. No alcohol use No drug use Tobacco use Never smoker.  Family History Ivor Costa, [place]; 09/17/2015 9:13 AM) Arthritis Mother. Diabetes Mellitus Mother. Heart Disease Father, Mother. Heart disease in female family member before age 65 Heart disease in female family member before age 51 Kidney Disease Mother. Migraine Headache Brother, Family Members In General, Mother, Sister.  Pregnancy / Birth History Ivor Costa, [place]; 09/17/2015 9:13 AM) Age at menarche 30 years. Contraceptive History Oral contraceptives. Gravida 2 Maternal age 15-30 Para 2  Review of Systems Ivor Costa CMA; 09/17/2015 9:13 AM) General Present- Fatigue. Not Present- Appetite Loss, Chills, Fever, Night Sweats, Weight Gain and Weight Loss. Skin Not Present- Change in Wart/Mole, Dryness, Hives, Jaundice, New Lesions, Non-Healing Wounds, Rash and Ulcer. HEENT Present- Wears glasses/contact lenses. Not Present- Earache, Hearing Loss, Hoarseness, Nose Bleed, Oral Ulcers, Ringing in the Ears, Seasonal Allergies, Sinus Pain, Sore Throat, Visual Disturbances and Yellow Eyes. Respiratory Present- Snoring. Not Present- Bloody sputum, Chronic Cough, Difficulty Breathing and Wheezing. Breast Not Present- Breast Mass, Breast Pain, Nipple Discharge and Skin Changes. Cardiovascular Present- Shortness of Breath. Not Present- Chest Pain, Difficulty Breathing Lying Down, Leg Cramps, Palpitations, Rapid Heart Rate and Swelling of Extremities. Gastrointestinal Not Present- Abdominal Pain, Bloating, Bloody Stool, Change in Bowel Habits, Chronic diarrhea,  Constipation, Difficulty Swallowing, Excessive gas, Gets full quickly at meals, Hemorrhoids, Indigestion, Nausea, Rectal Pain and Vomiting. Female Genitourinary Not Present- Frequency, Nocturia, Painful Urination,  Pelvic Pain and Urgency. Musculoskeletal Not Present- Back Pain, Joint Pain, Joint Stiffness, Muscle Pain, Muscle Weakness and Swelling of Extremities. Neurological Not Present- Decreased Memory, Fainting, Headaches, Numbness, Seizures, Tingling, Tremor, Trouble walking and Weakness. Psychiatric Not Present- Anxiety, Bipolar, Change in Sleep Pattern, Depression, Fearful and Frequent crying. Endocrine Present- Hot flashes. Not Present- Cold Intolerance, Excessive Hunger, Hair Changes, Heat Intolerance and New Diabetes. Hematology Not Present- Easy Bruising, Excessive bleeding, Gland problems, HIV and Persistent Infections.   Vitals Ivor Costa CMA; 09/17/2015 9:13 AM) 09/17/2015 9:13 AM Weight: 169.6 lb Temp.: 98.32F(Temporal)  Pulse: 78 (Regular)  Resp.: 16 (Unlabored)  BP: 132/84 (Sitting, Left Arm, Standard)    Physical Exam (Adarryl Goldammer B. Hassell Done Suarez; 09/17/2015 10:20 AM) The physical exam findings are as follows: Note:Head normocephalic Eyes wears glasses Nose and throat exam unremarkable Neck no bruits Chest clear to auscultation Heart sinus rhythm without murmurs or gallops. Abdomen nontender only scars prior hysterectomy Extremity exam for range of motion without cyanosis edema or clubbing. No history of DVT. Neuro alert and oriented 3. Emergency function grossly intact. Psych appropriate affect with good fund of knowledge.    Assessment & Plan Beth Suarez; 09/17/2015 10:25 AM) HIATUS HERNIA SYNDROME (K44.9) Impression: Will schedule for lap hiatus hernia repair and Nissen fundoplication Current Plans  Pt Education - Pathophysiology of reflux esophagitis: discussed with patient and provided information.

## 2015-09-17 NOTE — Patient Instructions (Signed)
Beth Suarez  09/17/2015   Your procedure is scheduled on: 09/22/2015    Report to [facility]  Entrance take [place]  elevators to 3rd floor to  [facility] at    0800 AM.  Call this number if you have problems the morning of surgery [phone]   Remember: ONLY 1 PERSON MAY GO WITH YOU TO SHORT STAY TO GET  READY MORNING OF [place].  Do not eat food or drink liquids :After Midnight.     Take these medicines the morning of surgery with A SIP OF WATER: Allergy eye drops if needed                                 You may not have any metal on your body including hair pins and              piercings  Do not wear jewelry, make-up, lotions, powders or perfumes, deodorant             Do not wear nail polish.  Do not shave  48 hours prior to surgery.                Do not bring valuables to the hospital. [place].  Contacts, dentures or bridgework may not be worn into surgery.  Leave suitcase in the car. After surgery it may be brought to your room.     :  Special Instructions: coughing and deep breathing exercises,leg exercises               Please read over the following fact sheets you were given: _____________________________________________________________________             [facility] - Preparing for Surgery Before surgery, you can play an important role.  Because skin is not sterile, your skin needs to be as free of germs as possible.  You can reduce the number of germs on your skin by washing with CHG (chlorahexidine gluconate) soap before surgery.  CHG is an antiseptic cleaner which kills germs and bonds with the skin to continue killing germs even after washing. Please DO NOT use if you have an allergy to CHG or antibacterial soaps.  If your skin becomes reddened/irritated stop using the CHG and inform your nurse when you arrive at Short Stay. Do not shave (including legs and underarms)  for at least 48 hours prior to the first CHG shower.  You may shave your face/neck. Please follow these instructions carefully:  1.  Shower with CHG Soap the night before surgery and the  morning of Surgery.  2.  If you choose to wash your hair, wash your hair first as usual with your  normal  shampoo.  3.  After you shampoo, rinse your hair and body thoroughly to remove the  shampoo.                           4.  Use CHG as you would any other liquid soap.  You can apply chg directly  to the skin and wash  Gently with a scrungie or clean washcloth.  5.  Apply the CHG Soap to your body ONLY FROM THE NECK DOWN.   Do not use on face/ open                           Wound or open sores. Avoid contact with eyes, ears mouth and genitals (private parts).                       Wash face,  Genitals (private parts) with your normal soap.             6.  Wash thoroughly, paying special attention to the area where your surgery  will be performed.  7.  Thoroughly rinse your body with warm water from the neck down.  8.  DO NOT shower/wash with your normal soap after using and rinsing off  the CHG Soap.                9.  Pat yourself dry with a clean towel.            10.  Wear clean pajamas.            11.  Place clean sheets on your bed the night of your first shower and do not  sleep with pets. Day of Surgery : Do not apply any lotions/deodorants the morning of surgery.  Please wear clean clothes to the hospital/surgery center.  FAILURE TO FOLLOW THESE INSTRUCTIONS MAY RESULT IN THE CANCELLATION OF YOUR SURGERY PATIENT SIGNATURE_________________________________  NURSE SIGNATURE__________________________________  ________________________________________________________________________

## 2015-09-18 ENCOUNTER — Encounter (HOSPITAL_COMMUNITY)
Admission: RE | Admit: 2015-09-18 | Discharge: 2015-09-18 | Disposition: A | Discharge status: Home / Self Care | Payer: Medicare Other | Source: Ambulatory Visit | Attending: Surgery | Admitting: Surgery

## 2015-09-18 ENCOUNTER — Encounter (HOSPITAL_COMMUNITY): Payer: Self-pay

## 2015-09-18 DIAGNOSIS — K449 Diaphragmatic hernia without obstruction or gangrene: Secondary | ICD-10-CM | POA: Diagnosis not present

## 2015-09-18 DIAGNOSIS — Z01818 Encounter for other preprocedural examination: Secondary | ICD-10-CM | POA: Insufficient documentation

## 2015-09-18 LAB — CBC
HEMATOCRIT: 38.1 % (ref 36.0–46.0)
Hemoglobin: 12.7 g/dL (ref 12.0–15.0)
MCH: 31.4 pg (ref 26.0–34.0)
MCHC: 33.3 g/dL (ref 30.0–36.0)
MCV: 94.3 fL (ref 78.0–100.0)
PLATELETS: 250 10*3/uL (ref 150–400)
RBC: 4.04 MIL/uL (ref 3.87–5.11)
RDW: 13.1 % (ref 11.5–15.5)
WBC: 6.1 10*3/uL (ref 4.0–10.5)

## 2015-09-18 LAB — BASIC METABOLIC PANEL
Anion gap: 4 — ABNORMAL LOW (ref 5–15)
BUN: 18 mg/dL (ref 6–20)
CALCIUM: 9.9 mg/dL (ref 8.9–10.3)
CO2: 26 mmol/L (ref 22–32)
CREATININE: 0.9 mg/dL (ref 0.44–1.00)
Chloride: 108 mmol/L (ref 101–111)
GFR calc Af Amer: >60 mL/min (ref >60)
GLUCOSE: 96 mg/dL (ref 65–99)
Potassium: 4.5 mmol/L (ref 3.5–5.1)
Sodium: 138 mmol/L (ref 135–145)

## 2015-09-18 NOTE — Progress Notes (Signed)
EKG- 03/04/15- EPIC  05/05/15- Stress- EPIC  LOV with cardiology- 04/22/15-EPIC

## 2015-09-22 ENCOUNTER — Encounter (HOSPITAL_COMMUNITY): Payer: Self-pay

## 2015-09-22 ENCOUNTER — Inpatient Hospital Stay (HOSPITAL_COMMUNITY): Payer: Medicare Other | Admitting: Anesthesiology

## 2015-09-22 ENCOUNTER — Encounter (HOSPITAL_COMMUNITY)
Admission: RE | Disposition: A | Discharge status: Home / Self Care | Payer: Self-pay | Source: Ambulatory Visit | Attending: Surgery

## 2015-09-22 ENCOUNTER — Inpatient Hospital Stay (HOSPITAL_COMMUNITY)
Admission: RE | Admit: 2015-09-22 | Discharge: 2015-09-24 | DRG: 328 | Disposition: A | Discharge status: Home / Self Care | Payer: Medicare Other | Source: Ambulatory Visit | Attending: Surgery | Admitting: Surgery

## 2015-09-22 DIAGNOSIS — G43909 Migraine, unspecified, not intractable, without status migrainosus: Secondary | ICD-10-CM | POA: Diagnosis present

## 2015-09-22 DIAGNOSIS — R0602 Shortness of breath: Secondary | ICD-10-CM | POA: Diagnosis present

## 2015-09-22 DIAGNOSIS — K210 Gastro-esophageal reflux disease with esophagitis: Secondary | ICD-10-CM | POA: Diagnosis present

## 2015-09-22 DIAGNOSIS — K449 Diaphragmatic hernia without obstruction or gangrene: Secondary | ICD-10-CM | POA: Diagnosis present

## 2015-09-22 DIAGNOSIS — M1990 Unspecified osteoarthritis, unspecified site: Secondary | ICD-10-CM | POA: Diagnosis present

## 2015-09-22 DIAGNOSIS — Z9889 Other specified postprocedural states: Secondary | ICD-10-CM

## 2015-09-22 DIAGNOSIS — E7800 Pure hypercholesterolemia, unspecified: Secondary | ICD-10-CM | POA: Diagnosis present

## 2015-09-22 HISTORY — PX: LAPAROSCOPIC NISSEN FUNDOPLICATION: SHX1932

## 2015-09-22 LAB — CBC
HCT: 36.7 % (ref 36.0–46.0)
Hemoglobin: 12.2 g/dL (ref 12.0–15.0)
MCH: 30.9 pg (ref 26.0–34.0)
MCHC: 33.2 g/dL (ref 30.0–36.0)
MCV: 92.9 fL (ref 78.0–100.0)
PLATELETS: 216 10*3/uL (ref 150–400)
RBC: 3.95 MIL/uL (ref 3.87–5.11)
RDW: 12.7 % (ref 11.5–15.5)
WBC: 10.8 10*3/uL — ABNORMAL HIGH (ref 4.0–10.5)

## 2015-09-22 LAB — CREATININE, SERUM
CREATININE: 0.97 mg/dL (ref 0.44–1.00)
GFR calc Af Amer: >60 mL/min (ref >60)
GFR, EST NON AFRICAN AMERICAN: 58 mL/min — AB (ref >60)

## 2015-09-22 SURGERY — FUNDOPLICATION, NISSEN, LAPAROSCOPIC
Anesthesia: General

## 2015-09-22 MED ORDER — OXYCODONE HCL 5 MG PO TABS: 5.0000 mg | ORAL_TABLET | Freq: Once | ORAL | Status: DC | PRN

## 2015-09-22 MED ORDER — DIPHENHYDRAMINE HCL 50 MG/ML IJ SOLN: 12.5000 mg | Freq: Four times a day (QID) | INTRAMUSCULAR | Status: DC | PRN

## 2015-09-22 MED ORDER — LIDOCAINE HCL (CARDIAC) 20 MG/ML IV SOLN
INTRAVENOUS | Status: AC
  Filled 2015-09-22: qty 5

## 2015-09-22 MED ORDER — SODIUM CHLORIDE 0.9 % IJ SOLN
INTRAMUSCULAR | Status: AC
  Filled 2015-09-22: qty 10

## 2015-09-22 MED ORDER — MIDAZOLAM HCL 2 MG/2ML IJ SOLN
INTRAMUSCULAR | Status: AC
  Filled 2015-09-22: qty 4

## 2015-09-22 MED ORDER — FENTANYL CITRATE (PF) 250 MCG/5ML IJ SOLN
INTRAMUSCULAR | Status: AC
  Filled 2015-09-22: qty 25

## 2015-09-22 MED ORDER — PROMETHAZINE HCL 25 MG/ML IJ SOLN
INTRAMUSCULAR | Status: AC
  Filled 2015-09-22: qty 1

## 2015-09-22 MED ORDER — DEXAMETHASONE SODIUM PHOSPHATE 10 MG/ML IJ SOLN
INTRAMUSCULAR | Status: DC | PRN
  Administered 2015-09-22: 10 mg via INTRAVENOUS

## 2015-09-22 MED ORDER — LIDOCAINE HCL (CARDIAC) 20 MG/ML IV SOLN
INTRAVENOUS | Status: DC | PRN
  Administered 2015-09-22: 50 mg via INTRAVENOUS

## 2015-09-22 MED ORDER — DEXAMETHASONE SODIUM PHOSPHATE 10 MG/ML IJ SOLN
INTRAMUSCULAR | Status: AC
  Filled 2015-09-22: qty 1

## 2015-09-22 MED ORDER — PANTOPRAZOLE SODIUM 40 MG IV SOLR
40.0000 mg | Freq: Every day | INTRAVENOUS | Status: DC
  Administered 2015-09-22 – 2015-09-23 (×2): 40 mg via INTRAVENOUS
  Filled 2015-09-22 (×3): qty 40

## 2015-09-22 MED ORDER — KCL IN DEXTROSE-NACL 20-5-0.45 MEQ/L-%-% IV SOLN
INTRAVENOUS | Status: DC
  Administered 2015-09-22 – 2015-09-24 (×5): via INTRAVENOUS
  Filled 2015-09-22 (×5): qty 1000

## 2015-09-22 MED ORDER — PROPOFOL 10 MG/ML IV BOLUS
INTRAVENOUS | Status: DC | PRN
  Administered 2015-09-22: 160 mg via INTRAVENOUS

## 2015-09-22 MED ORDER — NEOSTIGMINE METHYLSULFATE 10 MG/10ML IV SOLN
INTRAVENOUS | Status: AC
  Filled 2015-09-22: qty 1

## 2015-09-22 MED ORDER — GLYCOPYRROLATE 0.2 MG/ML IJ SOLN
INTRAMUSCULAR | Status: AC
  Filled 2015-09-22: qty 3

## 2015-09-22 MED ORDER — TISSEEL VH 10 ML EX KIT
PACK | CUTANEOUS | Status: AC
  Filled 2015-09-22: qty 1

## 2015-09-22 MED ORDER — HYDROMORPHONE HCL 1 MG/ML IJ SOLN
INTRAMUSCULAR | Status: AC
  Filled 2015-09-22: qty 1

## 2015-09-22 MED ORDER — ROCURONIUM BROMIDE 100 MG/10ML IV SOLN
INTRAVENOUS | Status: DC | PRN
  Administered 2015-09-22: 30 mg via INTRAVENOUS
  Administered 2015-09-22 (×2): 10 mg via INTRAVENOUS
  Administered 2015-09-22: 20 mg via INTRAVENOUS

## 2015-09-22 MED ORDER — TISSEEL VH 10 ML EX KIT
PACK | CUTANEOUS | Status: DC | PRN
  Administered 2015-09-22: 10 mL

## 2015-09-22 MED ORDER — BUPIVACAINE LIPOSOME 1.3 % IJ SUSP
20.0000 mL | Freq: Once | INTRAMUSCULAR | Status: DC
  Filled 2015-09-22: qty 20

## 2015-09-22 MED ORDER — EPHEDRINE SULFATE 50 MG/ML IJ SOLN
INTRAMUSCULAR | Status: AC
  Filled 2015-09-22: qty 1

## 2015-09-22 MED ORDER — CEFAZOLIN SODIUM-DEXTROSE 2-3 GM-% IV SOLR
INTRAVENOUS | Status: AC
  Filled 2015-09-22: qty 50

## 2015-09-22 MED ORDER — FENTANYL CITRATE (PF) 100 MCG/2ML IJ SOLN
12.5000 ug | INTRAMUSCULAR | Status: DC | PRN
  Administered 2015-09-22 – 2015-09-24 (×7): 12.5 ug via INTRAVENOUS
  Filled 2015-09-22 (×7): qty 2

## 2015-09-22 MED ORDER — ONDANSETRON HCL 4 MG/2ML IJ SOLN
INTRAMUSCULAR | Status: AC
  Filled 2015-09-22: qty 2

## 2015-09-22 MED ORDER — PROMETHAZINE HCL 25 MG/ML IJ SOLN
6.2500 mg | INTRAMUSCULAR | Status: DC | PRN
  Administered 2015-09-22: 6.25 mg via INTRAVENOUS

## 2015-09-22 MED ORDER — EPHEDRINE SULFATE 50 MG/ML IJ SOLN
INTRAMUSCULAR | Status: DC | PRN
  Administered 2015-09-22 (×3): 10 mg via INTRAVENOUS

## 2015-09-22 MED ORDER — PROPOFOL 10 MG/ML IV BOLUS
INTRAVENOUS | Status: AC
  Filled 2015-09-22: qty 20

## 2015-09-22 MED ORDER — OXYCODONE HCL 5 MG/5ML PO SOLN
5.0000 mg | Freq: Once | ORAL | Status: DC | PRN
  Filled 2015-09-22: qty 5

## 2015-09-22 MED ORDER — CHLORHEXIDINE GLUCONATE 4 % EX LIQD: 1.0000 "application " | Freq: Once | CUTANEOUS | Status: DC

## 2015-09-22 MED ORDER — ONDANSETRON 4 MG PO TBDP: 4.0000 mg | ORAL_TABLET | Freq: Four times a day (QID) | ORAL | Status: DC | PRN

## 2015-09-22 MED ORDER — ONDANSETRON HCL 4 MG/2ML IJ SOLN
4.0000 mg | Freq: Four times a day (QID) | INTRAMUSCULAR | Status: DC | PRN
  Filled 2015-09-22: qty 2

## 2015-09-22 MED ORDER — LACTATED RINGERS IR SOLN
Status: DC | PRN
  Administered 2015-09-22: 1

## 2015-09-22 MED ORDER — SUCCINYLCHOLINE CHLORIDE 20 MG/ML IJ SOLN
INTRAMUSCULAR | Status: DC | PRN
  Administered 2015-09-22: 100 mg via INTRAVENOUS

## 2015-09-22 MED ORDER — MIDAZOLAM HCL 5 MG/5ML IJ SOLN
INTRAMUSCULAR | Status: DC | PRN
  Administered 2015-09-22: 2 mg via INTRAVENOUS

## 2015-09-22 MED ORDER — DIPHENHYDRAMINE HCL 12.5 MG/5ML PO ELIX: 12.5000 mg | ORAL_SOLUTION | Freq: Four times a day (QID) | ORAL | Status: DC | PRN

## 2015-09-22 MED ORDER — CHLORHEXIDINE GLUCONATE 0.12 % MT SOLN
15.0000 mL | Freq: Two times a day (BID) | OROMUCOSAL | Status: DC
  Administered 2015-09-22 – 2015-09-24 (×5): 15 mL via OROMUCOSAL
  Filled 2015-09-22 (×7): qty 15

## 2015-09-22 MED ORDER — HYDROMORPHONE HCL 1 MG/ML IJ SOLN
0.2500 mg | INTRAMUSCULAR | Status: DC | PRN
  Administered 2015-09-22 (×2): 0.5 mg via INTRAVENOUS

## 2015-09-22 MED ORDER — NEOSTIGMINE METHYLSULFATE 10 MG/10ML IV SOLN
INTRAVENOUS | Status: DC | PRN
  Administered 2015-09-22: 4 mg via INTRAVENOUS

## 2015-09-22 MED ORDER — HEPARIN SODIUM (PORCINE) 5000 UNIT/ML IJ SOLN
5000.0000 [IU] | Freq: Once | INTRAMUSCULAR | Status: AC
  Administered 2015-09-22: 5000 [IU] via SUBCUTANEOUS
  Filled 2015-09-22: qty 1

## 2015-09-22 MED ORDER — ROCURONIUM BROMIDE 100 MG/10ML IV SOLN
INTRAVENOUS | Status: AC
  Filled 2015-09-22: qty 1

## 2015-09-22 MED ORDER — CETYLPYRIDINIUM CHLORIDE 0.05 % MT LIQD
7.0000 mL | Freq: Two times a day (BID) | OROMUCOSAL | Status: DC
  Administered 2015-09-22 – 2015-09-23 (×3): 7 mL via OROMUCOSAL

## 2015-09-22 MED ORDER — LACTATED RINGERS IV SOLN
INTRAVENOUS | Status: DC | PRN
  Administered 2015-09-22 (×2): via INTRAVENOUS

## 2015-09-22 MED ORDER — ONDANSETRON HCL 4 MG/2ML IJ SOLN
INTRAMUSCULAR | Status: DC | PRN
Start: 2015-09-22 — End: 2015-09-22
  Administered 2015-09-22: 4 mg via INTRAVENOUS

## 2015-09-22 MED ORDER — HEPARIN SODIUM (PORCINE) 5000 UNIT/ML IJ SOLN
5000.0000 [IU] | Freq: Three times a day (TID) | INTRAMUSCULAR | Status: DC
  Administered 2015-09-22 – 2015-09-24 (×5): 5000 [IU] via SUBCUTANEOUS
  Filled 2015-09-22 (×8): qty 1

## 2015-09-22 MED ORDER — CEFAZOLIN SODIUM-DEXTROSE 2-3 GM-% IV SOLR
2.0000 g | INTRAVENOUS | Status: AC
  Administered 2015-09-22: 2 g via INTRAVENOUS

## 2015-09-22 MED ORDER — FENTANYL CITRATE (PF) 100 MCG/2ML IJ SOLN
INTRAMUSCULAR | Status: DC | PRN
  Administered 2015-09-22 (×2): 50 ug via INTRAVENOUS
  Administered 2015-09-22: 100 ug via INTRAVENOUS
  Administered 2015-09-22: 50 ug via INTRAVENOUS

## 2015-09-22 MED ORDER — ACETAMINOPHEN 10 MG/ML IV SOLN
INTRAVENOUS | Status: AC
  Filled 2015-09-22: qty 100

## 2015-09-22 MED ORDER — ONDANSETRON HCL 4 MG/2ML IJ SOLN: 4.0000 mg | Freq: Four times a day (QID) | INTRAMUSCULAR | Status: DC | PRN

## 2015-09-22 MED ORDER — SODIUM CHLORIDE 0.9 % IJ SOLN
INTRAMUSCULAR | Status: AC
  Filled 2015-09-22: qty 20

## 2015-09-22 MED ORDER — GLYCOPYRROLATE 0.2 MG/ML IJ SOLN
INTRAMUSCULAR | Status: DC | PRN
  Administered 2015-09-22: 0.6 mg via INTRAVENOUS

## 2015-09-22 MED ORDER — LACTATED RINGERS IV SOLN
INTRAVENOUS | Status: DC
  Administered 2015-09-22: 14:00:00 via INTRAVENOUS

## 2015-09-22 SURGICAL SUPPLY — 58 items
APPLIER CLIP ROT 10 11.4 M/L (STAPLE)
BENZOIN TINCTURE PRP APPL 2/3 (GAUZE/BANDAGES/DRESSINGS) IMPLANT
CABLE HIGH FREQUENCY MONO STRZ (ELECTRODE) IMPLANT
CLAMP ENDO BABCK 10MM (STAPLE) IMPLANT
CLIP APPLIE ROT 10 11.4 M/L (STAPLE) IMPLANT
CLOSURE WOUND 1/2 X4 (GAUZE/BANDAGES/DRESSINGS)
COVER SURGICAL LIGHT HANDLE (MISCELLANEOUS) ×3 IMPLANT
DECANTER SPIKE VIAL GLASS SM (MISCELLANEOUS) ×3 IMPLANT
DEVICE SUT QUICK LOAD TK 5 (STAPLE) ×18 IMPLANT
DEVICE SUT TI-KNOT TK 5X26 (MISCELLANEOUS) ×2 IMPLANT
DEVICE SUTURE ENDOST 10MM (ENDOMECHANICALS) IMPLANT
DEVICE TI KNOT TK5 (MISCELLANEOUS) ×1
DISSECTOR BLUNT TIP ENDO 5MM (MISCELLANEOUS) ×3 IMPLANT
DRAIN PENROSE 18X1/2 LTX STRL (DRAIN) ×3 IMPLANT
DRAPE LAPAROSCOPIC ABDOMINAL (DRAPES) ×3 IMPLANT
ELECT L-HOOK LAP 45CM DISP (ELECTROSURGICAL) ×3
ELECT PENCIL ROCKER SW 15FT (MISCELLANEOUS) IMPLANT
ELECT REM PT RETURN 9FT ADLT (ELECTROSURGICAL) ×3
ELECTRODE L-HOOK LAP 45CM DISP (ELECTROSURGICAL) ×1 IMPLANT
ELECTRODE REM PT RTRN 9FT ADLT (ELECTROSURGICAL) ×1 IMPLANT
FILTER SMOKE EVAC LAPAROSHD (FILTER) IMPLANT
GLOVE BIOGEL M 8.0 STRL (GLOVE) ×3 IMPLANT
GOWN SPEC L4 XLG W/TWL (GOWN DISPOSABLE) ×3 IMPLANT
GOWN STRL REUS W/TWL XL LVL3 (GOWN DISPOSABLE) ×9 IMPLANT
GRASPER ENDO BABCOCK 10 (MISCELLANEOUS) IMPLANT
GRASPER ENDO BABCOCK 10MM (MISCELLANEOUS)
KIT BASIN OR (CUSTOM PROCEDURE TRAY) ×3 IMPLANT
LIQUID BAND (GAUZE/BANDAGES/DRESSINGS) ×3 IMPLANT
MESH HERNIA 7X10 (Mesh General) ×3 IMPLANT
QUICK LOAD TK 5 (STAPLE) ×9
SCISSORS LAP 5X45 EPIX DISP (ENDOMECHANICALS) ×3 IMPLANT
SEALANT SURGICAL APPL DUAL CAN (MISCELLANEOUS) ×3 IMPLANT
SET IRRIG TUBING LAPAROSCOPIC (IRRIGATION / IRRIGATOR) ×3 IMPLANT
SHEARS HARMONIC ACE PLUS 45CM (MISCELLANEOUS) ×3 IMPLANT
SLEEVE ADV FIXATION 12X100MM (TROCAR) IMPLANT
SLEEVE ADV FIXATION 5X100MM (TROCAR) ×3 IMPLANT
SOLUTION ANTI FOG 6CC (MISCELLANEOUS) ×3 IMPLANT
STAPLER VISISTAT 35W (STAPLE) ×3 IMPLANT
STRIP CLOSURE SKIN 1/2X4 (GAUZE/BANDAGES/DRESSINGS) IMPLANT
SUT ETHIBOND 2 0 SH (SUTURE) ×4
SUT ETHIBOND 2 0 SH 36X2 (SUTURE) ×2 IMPLANT
SUT SURGIDAC NAB ES-9 0 48 120 (SUTURE) ×27 IMPLANT
SUT VIC AB 2-0 SH 27 (SUTURE) ×2
SUT VIC AB 2-0 SH 27X BRD (SUTURE) ×1 IMPLANT
SUT VIC AB 4-0 SH 18 (SUTURE) ×3 IMPLANT
TIP INNERVISION DETACH 40FR (MISCELLANEOUS) IMPLANT
TIP INNERVISION DETACH 50FR (MISCELLANEOUS) IMPLANT
TIP INNERVISION DETACH 56FR (MISCELLANEOUS) ×3 IMPLANT
TIPS INNERVISION DETACH 40FR (MISCELLANEOUS)
TOWEL OR 17X26 10 PK STRL BLUE (TOWEL DISPOSABLE) ×6 IMPLANT
TOWEL OR NON WOVEN STRL DISP B (DISPOSABLE) ×3 IMPLANT
TRAY FOLEY W/METER SILVER 14FR (SET/KITS/TRAYS/PACK) ×3 IMPLANT
TRAY FOLEY W/METER SILVER 16FR (SET/KITS/TRAYS/PACK) ×3 IMPLANT
TRAY LAPAROSCOPIC (CUSTOM PROCEDURE TRAY) ×3 IMPLANT
TROCAR ADV FIXATION 12X100MM (TROCAR) ×3 IMPLANT
TROCAR ADV FIXATION 5X100MM (TROCAR) ×3 IMPLANT
TROCAR BLADELESS OPT 5 100 (ENDOMECHANICALS) ×3 IMPLANT
TUBING FILTER THERMOFLATOR (ELECTROSURGICAL) ×3 IMPLANT

## 2015-09-22 NOTE — Op Note (Signed)
Surgeon: Kaylyn Lim, MD, FACS  Asst:  Greer Pickerel, MD, FACS  Anes:  general  Procedure: Laparoscopic repair of large type III hiatus hernia with primary closure of the diaphragm, buttress with biologic mesh and Nissen fundoplication over a 46 bougie  Diagnosis: Large type III mixed hiatus hernia  Complications: none  EBL:   minimal cc  Drains: none  Description of Procedure:  The patient was taken to OR 4 at [facility].  After anesthesia was administered and the patient was prepped a timeout was performed.  Six ports were used after a 5 mm was placed in the left upper quadrant via the Optiview.  A 12 mm was placed to the right of the midline.  The Nathanson retracted the liver.  Dissection was begun on the right crus and teased hernia sac from the mediastinum.  Short gastrics were taken down on the left to mobilized the cardia and allow the hernia sac to be brought into the abdomen.  The crura were dissected bilaterally and repair done posteriorally with about 5 zero Surgidek sutures secured with a TyKnot.  Cooke biologic mesh patch was used to reinforce the closure and was secured with 2-0 Surgidek.  A 56 lighted bougie was passed and the Nissen fundoplication completed with 3 sutures of Surgidek securing the top two with Ty knots and the lower most one with a free tie.  Tisseal applied.  The sac was tacked down on to the stomach below.  Bougie withdrawn.  Trocars injected with Exparel and closed with 4-0 vicryl and Liquiban.  Pictures of the repair are placed in EPIC.    The patient tolerated the procedure well and was taken to the PACU in stable condition.     Matt B. Hassell Done, [place], [facility], [place]

## 2015-09-22 NOTE — Anesthesia Postprocedure Evaluation (Signed)
Anesthesia Post Note  Patient: Beth Suarez  Procedure(s) Performed: Procedure(s) (LRB): LAPAROSCOPIC NISSEN FUNDOPLICATION AND REPAIR OF HIATUS HERNIA (N/A)  Anesthesia type: General  Patient location: PACU  Post pain: Pain level controlled and Adequate analgesia  Post assessment: Post-op Vital signs reviewed, Patient's Cardiovascular Status Stable, Respiratory Function Stable, Patent Airway and Pain level controlled  Last Vitals:  Filed Vitals:   09/22/15 1553  BP: 129/64  Pulse: 68  Temp: 36.7 C  Resp: 15    Post vital signs: Reviewed and stable  Level of consciousness: awake, alert  and oriented  Complications: No apparent anesthesia complications

## 2015-09-22 NOTE — Anesthesia Preprocedure Evaluation (Signed)
Anesthesia Evaluation  Patient identified by MRN, date of birth, ID band Patient awake    Reviewed: Allergy & Precautions, NPO status , Patient's Chart, lab work & pertinent test results  Airway Mallampati: II   Neck ROM: full    Dental   Pulmonary shortness of breath,    breath sounds clear to auscultation       Cardiovascular negative cardio ROS   Rhythm:regular Rate:Normal     Neuro/Psych  Headaches,  Neuromuscular disease    GI/Hepatic hiatal hernia, GERD  ,  Endo/Other    Renal/GU      Musculoskeletal  (+) Arthritis ,   Abdominal   Peds  Hematology   Anesthesia Other Findings   Reproductive/Obstetrics                             Anesthesia Physical Anesthesia Plan  ASA: II  Anesthesia Plan: General   Post-op Pain Management:    Induction: Intravenous  Airway Management Planned: Oral ETT  Additional Equipment:   Intra-op Plan:   Post-operative Plan: Extubation in OR  Informed Consent: I have reviewed the patients History and Physical, chart, labs and discussed the procedure including the risks, benefits and alternatives for the proposed anesthesia with the patient or authorized representative who has indicated his/her understanding and acceptance.     Plan Discussed with: CRNA, Anesthesiologist and Surgeon  Anesthesia Plan Comments:         Anesthesia Quick Evaluation

## 2015-09-22 NOTE — Anesthesia Procedure Notes (Signed)
Procedure Name: Intubation Date/Time: 09/22/2015 10:29 AM Performed by: Noralyn Pick D Pre-anesthesia Checklist: Patient identified, Emergency Drugs available, Suction available and Patient being monitored Patient Re-evaluated:Patient Re-evaluated prior to inductionOxygen Delivery Method: Circle System Utilized Preoxygenation: Pre-oxygenation with 100% oxygen Intubation Type: IV induction, Rapid sequence and Cricoid Pressure applied Laryngoscope Size: Mac and 4 Grade View: Grade II Tube type: Oral Tube size: 7.5 mm Number of attempts: 1 Airway Equipment and Method: Stylet and Oral airway Placement Confirmation: ETT inserted through vocal cords under direct vision,  positive ETCO2 and breath sounds checked- equal and bilateral Secured at: 21 cm Tube secured with: Tape Dental Injury: Teeth and Oropharynx as per pre-operative assessment

## 2015-09-22 NOTE — Interval H&P Note (Signed)
History and Physical Interval Note:  09/22/2015 9:20 AM  Beth Suarez  has presented today for surgery, with the diagnosis of TYPE 2 MIXED HIATUS HERNIA  The various methods of treatment have been discussed with the patient and family. After consideration of risks, benefits and other options for treatment, the patient has consented to  Procedure(s): LAPAROSCOPIC NISSEN FUNDOPLICATION AND REPAIR OF HIATUS HERNIA (N/A) as a surgical intervention .  The patient's history has been reviewed, patient examined, no change in status, stable for surgery.  I have reviewed the patient's chart and labs.  Questions were answered to the patient's satisfaction.     Colinda Barth B

## 2015-09-22 NOTE — Transfer of Care (Signed)
Immediate Anesthesia Transfer of Care Note  Patient: Beth Suarez  Procedure(s) Performed: Procedure(s): LAPAROSCOPIC NISSEN FUNDOPLICATION AND REPAIR OF HIATUS HERNIA (N/A)  Patient Location: PACU  Anesthesia Type:General  Level of Consciousness: awake, alert  and oriented  Airway & Oxygen Therapy: Patient Spontanous Breathing and Patient connected to face mask oxygen  Post-op Assessment: Report given to RN and Post -op Vital signs reviewed and stable  Post vital signs: Reviewed and stable  Last Vitals:  Filed Vitals:   09/22/15 0800  BP: 133/86  Pulse: 65  Temp: 36.3 C  Resp: 18    Complications: No apparent anesthesia complications

## 2015-09-22 NOTE — H&P (View-Only) (Signed)
Elliot Dally Wence 09/17/2015 9:12 AM Location: [facility] Patient #: [id number] DOB: 1946-05-29 Married / Language: English / Race: White Female  History of Present Illness Rodman Key B. Hassell Done MD; 09/17/2015 10:19 AM) Patient words: Evaluate Hiatal Hernia.  The patient is a 69 year old female who presents with a hiatal hernia. Mr and Mrs. Delgrande recommended today to discuss her large hiatal hernia. I pulled up her upper GI series and showed it to them which shows he upper portion of her stomach is herniated above her diaphragm. She is had a hiatal hernia for some time along with symptoms of reflux. This is changed recently and now she has more problems with pressure in her chest. I discussed the etiology of her symptoms. She has really severe migraine headaches and does a lot of vomiting currently as a child which probably contributed to this. I described hiatal hernia repair along with Nissen fundoplication including the risk of recurrence, risk of issues related to that and the limitations of the procedure. She is eager to proceed. I gave her a booklet on this. I felt that informed consent was obtained. She is aware too of the postop diet.    Other Problems Ivor Costa, [place]; 09/17/2015 9:13 AM) Chest pain Gastroesophageal Reflux Disease Hypercholesterolemia Migraine Headache Oophorectomy  Past Surgical History Ivor Costa, [place]; 09/17/2015 9:13 AM) Hysterectomy (not due to cancer) - Complete Knee Surgery Left.  Diagnostic Studies History Ivor Costa, [place]; 09/17/2015 9:13 AM) Colonoscopy within last year Mammogram 1-3 years ago Pap Smear >5 years ago  Allergies Ivor Costa, [place]; 09/17/2015 9:14 AM) Codeine Phosphate *ANALGESICS - OPIOID*  Medication History Ivor Costa, CMA; 09/17/2015 9:36 AM) Vitamin C (Oral) Active. Lipitor (10MG  Tablet, Oral) Active. Biotin (1MG  Capsule, Oral) Active. Calcium D-Glucarate (500MG  Capsule, Oral)  Active. Vitamin D3 (2000UNIT Capsule, Oral) Active. KlonoPIN (0.5MG  Tablet, Oral) Active. Iron Caps CR (250MG  Capsule ER, Oral) Active. Omega 3-6-9 Fatty Acids (Oral) Active. Ketotifen Fumarate (0.025% Solution, Ophthalmic) Active. Medications Reconciled  Social History Ivor Costa, [place]; 09/17/2015 9:13 AM) Caffeine use Carbonated beverages, Tea. No alcohol use No drug use Tobacco use Never smoker.  Family History Ivor Costa, [place]; 09/17/2015 9:13 AM) Arthritis Mother. Diabetes Mellitus Mother. Heart Disease Father, Mother. Heart disease in female family member before age 23 Heart disease in female family member before age 6 Kidney Disease Mother. Migraine Headache Brother, Family Members In General, Mother, Sister.  Pregnancy / Birth History Ivor Costa, [place]; 09/17/2015 9:13 AM) Age at menarche 80 years. Contraceptive History Oral contraceptives. Gravida 2 Maternal age 50-30 Para 2  Review of Systems Ivor Costa CMA; 09/17/2015 9:13 AM) General Present- Fatigue. Not Present- Appetite Loss, Chills, Fever, Night Sweats, Weight Gain and Weight Loss. Skin Not Present- Change in Wart/Mole, Dryness, Hives, Jaundice, New Lesions, Non-Healing Wounds, Rash and Ulcer. HEENT Present- Wears glasses/contact lenses. Not Present- Earache, Hearing Loss, Hoarseness, Nose Bleed, Oral Ulcers, Ringing in the Ears, Seasonal Allergies, Sinus Pain, Sore Throat, Visual Disturbances and Yellow Eyes. Respiratory Present- Snoring. Not Present- Bloody sputum, Chronic Cough, Difficulty Breathing and Wheezing. Breast Not Present- Breast Mass, Breast Pain, Nipple Discharge and Skin Changes. Cardiovascular Present- Shortness of Breath. Not Present- Chest Pain, Difficulty Breathing Lying Down, Leg Cramps, Palpitations, Rapid Heart Rate and Swelling of Extremities. Gastrointestinal Not Present- Abdominal Pain, Bloating, Bloody Stool, Change in Bowel Habits, Chronic diarrhea,  Constipation, Difficulty Swallowing, Excessive gas, Gets full quickly at meals, Hemorrhoids, Indigestion, Nausea, Rectal Pain and Vomiting. Female Genitourinary Not Present- Frequency, Nocturia, Painful Urination,  Pelvic Pain and Urgency. Musculoskeletal Not Present- Back Pain, Joint Pain, Joint Stiffness, Muscle Pain, Muscle Weakness and Swelling of Extremities. Neurological Not Present- Decreased Memory, Fainting, Headaches, Numbness, Seizures, Tingling, Tremor, Trouble walking and Weakness. Psychiatric Not Present- Anxiety, Bipolar, Change in Sleep Pattern, Depression, Fearful and Frequent crying. Endocrine Present- Hot flashes. Not Present- Cold Intolerance, Excessive Hunger, Hair Changes, Heat Intolerance and New Diabetes. Hematology Not Present- Easy Bruising, Excessive bleeding, Gland problems, HIV and Persistent Infections.   Vitals Ivor Costa CMA; 09/17/2015 9:13 AM) 09/17/2015 9:13 AM Weight: 169.6 lb Temp.: 98.28F(Temporal)  Pulse: 78 (Regular)  Resp.: 16 (Unlabored)  BP: 132/84 (Sitting, Left Arm, Standard)    Physical Exam (Lavan Imes B. Hassell Done MD; 09/17/2015 10:20 AM) The physical exam findings are as follows: Note:Head normocephalic Eyes wears glasses Nose and throat exam unremarkable Neck no bruits Chest clear to auscultation Heart sinus rhythm without murmurs or gallops. Abdomen nontender only scars prior hysterectomy Extremity exam for range of motion without cyanosis edema or clubbing. No history of DVT. Neuro alert and oriented 3. Emergency function grossly intact. Psych appropriate affect with good fund of knowledge.    Assessment & Plan Rodman Key B. Hassell Done MD; 09/17/2015 10:25 AM) HIATUS HERNIA SYNDROME (K44.9) Impression: Will schedule for lap hiatus hernia repair and Nissen fundoplication Current Plans  Pt Education - Pathophysiology of reflux esophagitis: discussed with patient and provided information.

## 2015-09-23 ENCOUNTER — Inpatient Hospital Stay (HOSPITAL_COMMUNITY): Payer: Medicare Other

## 2015-09-23 LAB — BASIC METABOLIC PANEL
Anion gap: 8 (ref 5–15)
BUN: 14 mg/dL (ref 6–20)
CALCIUM: 9.4 mg/dL (ref 8.9–10.3)
CHLORIDE: 105 mmol/L (ref 101–111)
CO2: 25 mmol/L (ref 22–32)
CREATININE: 0.74 mg/dL (ref 0.44–1.00)
GFR calc Af Amer: >60 mL/min (ref >60)
Glucose, Bld: 153 mg/dL — ABNORMAL HIGH (ref 65–99)
Potassium: 4.6 mmol/L (ref 3.5–5.1)
SODIUM: 138 mmol/L (ref 135–145)

## 2015-09-23 LAB — CBC
HCT: 36.3 % (ref 36.0–46.0)
Hemoglobin: 12.2 g/dL (ref 12.0–15.0)
MCH: 31.2 pg (ref 26.0–34.0)
MCHC: 33.6 g/dL (ref 30.0–36.0)
MCV: 92.8 fL (ref 78.0–100.0)
PLATELETS: 234 10*3/uL (ref 150–400)
RBC: 3.91 MIL/uL (ref 3.87–5.11)
RDW: 13 % (ref 11.5–15.5)
WBC: 10.1 10*3/uL (ref 4.0–10.5)

## 2015-09-23 MED ORDER — IOHEXOL 300 MG/ML  SOLN
50.0000 mL | Freq: Once | INTRAMUSCULAR | Status: DC | PRN
  Administered 2015-09-23: 50 mL via INTRAVENOUS
  Filled 2015-09-23: qty 50

## 2015-09-23 NOTE — Care Management Note (Signed)
Case Management Note  Patient Details  Name: Beth Suarez MRN: [id number] Date of Birth: Apr 15, 1946  Subjective/Objective:           Laparoscopic repair of hiatus hernia with primary closure of the diaphragm, and Nissen fundoplication          Action/Plan: Discharge planning   Expected Discharge Date:  09/24/15               Expected Discharge Plan:  Home/Self Care  In-House Referral:  NA  Discharge planning Services  CM Consult  Post Acute Care Choice:  NA Choice offered to:  NA  DME Arranged:  N/A DME Agency:  NA  HH Arranged:  NA HH Agency:  NA  Status of Service:  Completed, signed off  Medicare Important Message Given:    Date Medicare IM Given:    Medicare IM give by:    Date Additional Medicare IM Given:    Additional Medicare Important Message give by:     If discussed at [place] of Stay Meetings, dates discussed:    Additional Comments:  Guadalupe Maple, RN 09/23/2015, 10:20 AM

## 2015-09-23 NOTE — Progress Notes (Signed)
Patient ID: Beth Suarez, female   DOB: [birth year], 69 y.o.   MRN: [id number] [facility] Progress Note:   1 Day Post-Op  Subjective: Mental status is clear.  No complaints except shoulder pain last night Objective: Vital signs in last 24 hours: Temp:  [97.5 F (36.4 C)-98.6 F (37 C)] 98 F (36.7 C) (10/12 1115) Pulse Rate:  [61-89] 71 (10/12 1115) Resp:  [12-18] 18 (10/12 1115) BP: (117-145)/(64-82) 127/81 mmHg (10/12 1115) SpO2:  [95 %-100 %] 99 % (10/12 1115)  Intake/Output from previous day: 10/11 0701 - 10/12 0700 In: 3493.3 [I.V.:3493.3] Out: 3950 [Urine:3900; Blood:50] Intake/Output this shift:    Physical Exam: Work of breathing is not labored.  Incisions OK  Lab Results:  Results for orders placed or performed during the hospital encounter of 09/22/15 (from the past 48 hour(s))  CBC     Status: Abnormal   Collection Time: 09/22/15  3:37 PM  Result Value Ref Range   WBC 10.8 (H) 4.0 - 10.5 K/uL   RBC 3.95 3.87 - 5.11 MIL/uL   Hemoglobin 12.2 12.0 - 15.0 g/dL   HCT 36.7 36.0 - 46.0 %   MCV 92.9 78.0 - 100.0 fL   MCH 30.9 26.0 - 34.0 pg   MCHC 33.2 30.0 - 36.0 g/dL   RDW 12.7 11.5 - 15.5 %   Platelets 216 150 - 400 K/uL  Creatinine, serum     Status: Abnormal   Collection Time: 09/22/15  3:37 PM  Result Value Ref Range   Creatinine, Ser 0.97 0.44 - 1.00 mg/dL   GFR calc non Af Amer 58 (L) >60 mL/min   GFR calc Af Amer >60 >60 mL/min    Comment: (NOTE) The eGFR has been calculated using the CKD EPI equation. This calculation has not been validated in all clinical situations. eGFR's persistently <60 mL/min signify possible Chronic Kidney Disease.   CBC     Status: None   Collection Time: 09/23/15  5:05 AM  Result Value Ref Range   WBC 10.1 4.0 - 10.5 K/uL   RBC 3.91 3.87 - 5.11 MIL/uL   Hemoglobin 12.2 12.0 - 15.0 g/dL   HCT 36.3 36.0 - 46.0 %   MCV 92.8 78.0 - 100.0 fL   MCH 31.2 26.0 - 34.0 pg   MCHC 33.6 30.0 - 36.0 g/dL   RDW 13.0  11.5 - 15.5 %   Platelets 234 150 - 400 K/uL  Basic metabolic panel     Status: Abnormal   Collection Time: 09/23/15  5:05 AM  Result Value Ref Range   Sodium 138 135 - 145 mmol/L   Potassium 4.6 3.5 - 5.1 mmol/L   Chloride 105 101 - 111 mmol/L   CO2 25 22 - 32 mmol/L   Glucose, Bld 153 (H) 65 - 99 mg/dL   BUN 14 6 - 20 mg/dL   Creatinine, Ser 0.74 0.44 - 1.00 mg/dL   Calcium 9.4 8.9 - 10.3 mg/dL   GFR calc non Af Amer >60 >60 mL/min   GFR calc Af Amer >60 >60 mL/min    Comment: (NOTE) The eGFR has been calculated using the CKD EPI equation. This calculation has not been validated in all clinical situations. eGFR's persistently <60 mL/min signify possible Chronic Kidney Disease.    Anion gap 8 5 - 15    Radiology/Results: Dg Ugi W/water Sol Cm  09/23/2015  CLINICAL DATA:  Postop for hiatal hernia repair yesterday. EXAM: WATER SOLUBLE UPPER GI SERIES TECHNIQUE: Single-column  upper GI series was performed using water soluble contrast. CONTRAST:  Approximately 80 cc of water soluble contrast COMPARISON:  Preoperative exam of 06/26/2015 FLUOROSCOPY TIME:  If the device does not provide the exposure index: Fluoroscopy Time (in minutes and seconds):  1 minutes 37 seconds Number of Acquired Images:  1 FINDINGS: Preprocedure scout film demonstrates mild prominence of gas-filled colon. Focused single-contrast exam demonstrates expected appearance of fundoplication. No contrast extravasation to suggest leak. There may be mild postoperative edema at day surgical site, with slight delay of contrast passage. The stomach is mildly distended, suggesting a component of postoperative adynamic ileus. IMPRESSION: Expected appearance after hiatal hernia repair. Electronically Signed   By: Abigail Miyamoto M.D.   On: 09/23/2015 10:14    Anti-infectives: Anti-infectives    Start     Dose/Rate Route Frequency Ordered Stop   09/22/15 0807  ceFAZolin (ANCEF) IVPB 2 g/50 mL premix     2 g 100 mL/hr over 30  Minutes Intravenous On call to O.R. 09/22/15 0807 09/22/15 1031      Assessment/Plan: Problem List: Patient Active Problem List   Diagnosis Date Noted  . Anemia, iron deficiency 05/04/2015  . Hiatal hernia - large and symptomatic 05/04/2015  . Dysphagia 05/04/2015  . History of colonic polyps 08/23/2011    Swallow OK.  Begin clear liquids 1 Day Post-Op    LOS: 1 day   Matt B. Hassell Done, MD, [facility] [phone] beeper [phone]  09/23/2015 12:25 PM

## 2015-09-24 LAB — CBC
HCT: 38.7 % (ref 36.0–46.0)
Hemoglobin: 12.5 g/dL (ref 12.0–15.0)
MCH: 30.4 pg (ref 26.0–34.0)
MCHC: 32.3 g/dL (ref 30.0–36.0)
MCV: 94.2 fL (ref 78.0–100.0)
PLATELETS: 214 10*3/uL (ref 150–400)
RBC: 4.11 MIL/uL (ref 3.87–5.11)
RDW: 13.3 % (ref 11.5–15.5)
WBC: 8.8 10*3/uL (ref 4.0–10.5)

## 2015-09-24 LAB — BASIC METABOLIC PANEL
Anion gap: 7 (ref 5–15)
BUN: 11 mg/dL (ref 6–20)
CO2: 24 mmol/L (ref 22–32)
CREATININE: 0.83 mg/dL (ref 0.44–1.00)
Calcium: 9 mg/dL (ref 8.9–10.3)
Chloride: 103 mmol/L (ref 101–111)
GFR calc Af Amer: >60 mL/min (ref >60)
Glucose, Bld: 116 mg/dL — ABNORMAL HIGH (ref 65–99)
POTASSIUM: 3.9 mmol/L (ref 3.5–5.1)
SODIUM: 134 mmol/L — AB (ref 135–145)

## 2015-09-24 MED ORDER — ONDANSETRON 4 MG PO TBDP: 4.0000 mg | ORAL_TABLET | Freq: Four times a day (QID) | ORAL | Status: DC | PRN

## 2015-09-24 MED ORDER — HYDROCODONE-ACETAMINOPHEN 7.5-325 MG/15ML PO SOLN: 15.0000 mL | ORAL | Status: DC | PRN

## 2015-09-24 NOTE — Discharge Summary (Signed)
Physician Discharge Summary  Patient ID: Beth Suarez MRN: [id number] DOB/AGE: 1946-02-16 69 y.o.  Admit date: 09/22/2015 Discharge date: 09/24/2015  Admission Diagnoses:  Type III mixed hiatus hernia  Discharge Diagnoses:  same  Active Problems:   Hiatal hernia - large and symptomatic   Surgery:  Lap repair of hiatus hernia with biologic mesh and Nissen fundoplication over a 85 dilator  Discharged Condition: improved  Hospital Course:   Had surgery.  UGI on PD 1 looked good.  Tolerated liquids and ready for discharge on PD 2  Consults: none  Significant Diagnostic Studies: UIG    Discharge Exam: Blood pressure 117/75, pulse 74, temperature 97.9 F (36.6 C), temperature source Oral, resp. rate 18, height 5\' 7"  (1.702 m), weight 76.204 kg (168 lb), SpO2 99 %. Incisions clear  Disposition: 01-Home or Self Care  Discharge Instructions    Call MD for:  persistant nausea and vomiting    Complete by:  As directed      Call MD for:  redness, tenderness, or signs of infection (pain, swelling, redness, odor or green/yellow discharge around incision site)    Complete by:  As directed      Call MD for:  severe uncontrolled pain    Complete by:  As directed      Call MD for:  temperature >100.4    Complete by:  As directed      Diet general    Complete by:  As directed   Full liquids for first week after your surgery.  Then stay on a pureed diet for the next 4 weeks.     Discharge instructions    Complete by:  As directed   May shower and shampoo today Drink full liquids for a day and then pureed foods for 4 weeks     Increase activity slowly    Complete by:  As directed      No wound care    Complete by:  As directed             Medication List    TAKE these medications        ALLERGY EYE DROPS 0.025 % ophthalmic solution  Generic drug:  ketotifen  Place 1 drop into both eyes daily as needed (allergy).     atorvastatin 10 MG tablet  Commonly known as:   LIPITOR  Take 1 tablet by mouth daily.     Biotin 10 MG Tabs  Take 1 tablet by mouth daily.     Calcium-Vitamin D 600-125 MG-UNIT Tabs  Take 2 tablets by mouth daily.     clonazePAM 0.5 MG tablet  Commonly known as:  KLONOPIN  Take 1 tablet by mouth daily as needed (restless legs).     Fish Oil 1000 MG Caps  Take 1 capsule by mouth daily.     HYDROcodone-acetaminophen 7.5-325 mg/15 ml solution  Commonly known as:  HYCET  Take 15 mLs by mouth every 4 (four) hours as needed for moderate pain.     ibuprofen 200 MG tablet  Commonly known as:  ADVIL,MOTRIN  Take 200 mg by mouth every 6 (six) hours as needed for mild pain or moderate pain.     Iron 240 (27 FE) MG Tabs  Take 1 tablet by mouth 2 (two) times daily.     multivitamin tablet  Take 1 tablet by mouth daily.     ondansetron 4 MG disintegrating tablet  Commonly known as:  ZOFRAN-ODT  Take 1 tablet (4 mg  total) by mouth every 6 (six) hours as needed for nausea.     vitamin C 1000 MG tablet  Take 2,000 mg by mouth daily.     Vitamin D3 5000 UNITS Caps  Take 1 capsule by mouth daily.         SignedPedro Earls 09/24/2015, 9:56 AM

## 2015-09-24 NOTE — Progress Notes (Signed)
Patient is alert and oriented. VS stable. No s/s of acute distress. Tolerating clear liquid diet. Patient being discharged to home. Port sites all clean dry and intact. Reviewed discharge instructions, education and medications.

## 2015-11-26 ENCOUNTER — Encounter: Payer: Self-pay | Admitting: Internal Medicine

## 2016-01-29 DIAGNOSIS — Z1231 Encounter for screening mammogram for malignant neoplasm of breast: Secondary | ICD-10-CM | POA: Diagnosis not present

## 2016-04-16 DIAGNOSIS — R110 Nausea: Secondary | ICD-10-CM | POA: Diagnosis not present

## 2016-04-16 DIAGNOSIS — B349 Viral infection, unspecified: Secondary | ICD-10-CM | POA: Diagnosis not present

## 2016-05-04 DIAGNOSIS — R5383 Other fatigue: Secondary | ICD-10-CM | POA: Diagnosis not present

## 2016-05-04 DIAGNOSIS — Z6825 Body mass index (BMI) 25.0-25.9, adult: Secondary | ICD-10-CM | POA: Diagnosis not present

## 2016-05-04 DIAGNOSIS — Z7189 Other specified counseling: Secondary | ICD-10-CM | POA: Diagnosis not present

## 2016-05-04 DIAGNOSIS — E7800 Pure hypercholesterolemia, unspecified: Secondary | ICD-10-CM | POA: Diagnosis not present

## 2016-05-04 DIAGNOSIS — D509 Iron deficiency anemia, unspecified: Secondary | ICD-10-CM | POA: Diagnosis not present

## 2016-05-04 DIAGNOSIS — Z0000 Encounter for general adult medical examination without abnormal findings: Secondary | ICD-10-CM | POA: Diagnosis not present

## 2016-05-04 DIAGNOSIS — Z299 Encounter for prophylactic measures, unspecified: Secondary | ICD-10-CM | POA: Diagnosis not present

## 2016-05-04 DIAGNOSIS — Z79899 Other long term (current) drug therapy: Secondary | ICD-10-CM | POA: Diagnosis not present

## 2016-05-04 DIAGNOSIS — Z1389 Encounter for screening for other disorder: Secondary | ICD-10-CM | POA: Diagnosis not present

## 2016-07-25 IMAGING — RF DG UGI W/ HIGH DENSITY W/KUB
14 of 24 series · 14 of 24 positions shown · non-contrast
Comparison: None.

CLINICAL DATA: Dysphagia, hiatal hernia

EXAM:
UPPER GI SERIES WITHOUT KUB
TECHNIQUE: Routine upper GI series was performed with thin/high density/water
soluble barium.
FLUOROSCOPY TIME:  Radiation Exposure Index (as provided by the
fluoroscopic device): 9 mGy

[Series 1: run · 1 of 1 slices shown (1 of 14)]
[im 1/1]
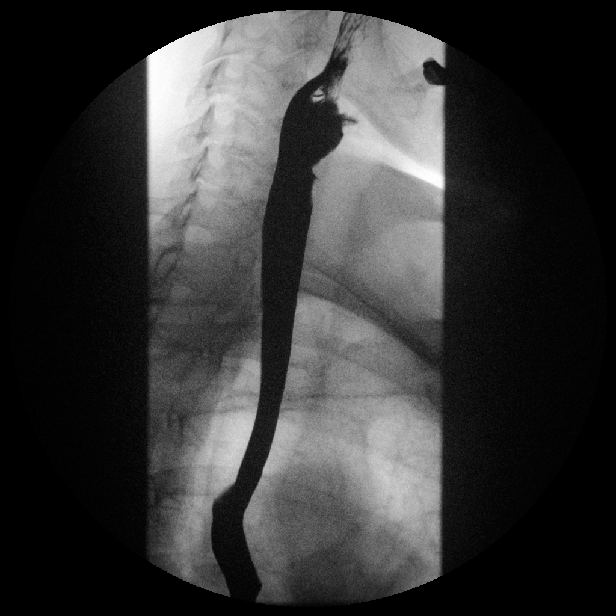

[Series 3: run · 1 of 1 slices shown (2 of 14)]
[im 1/1]
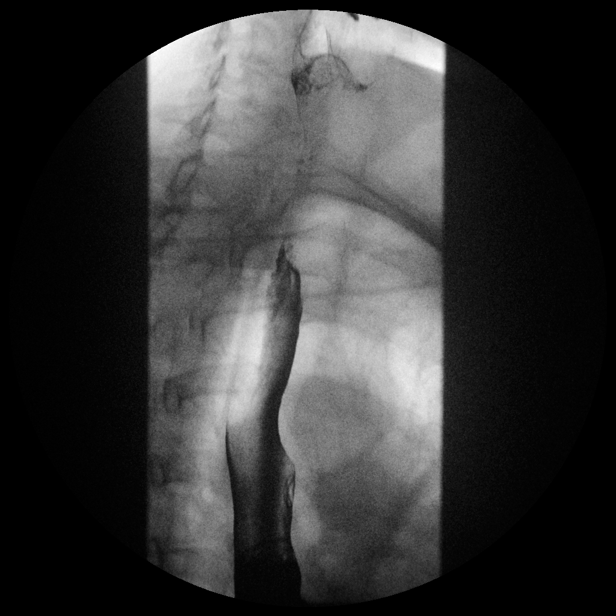

[Series 5: run · 1 of 1 slices shown (3 of 14)]
[im 1/1]
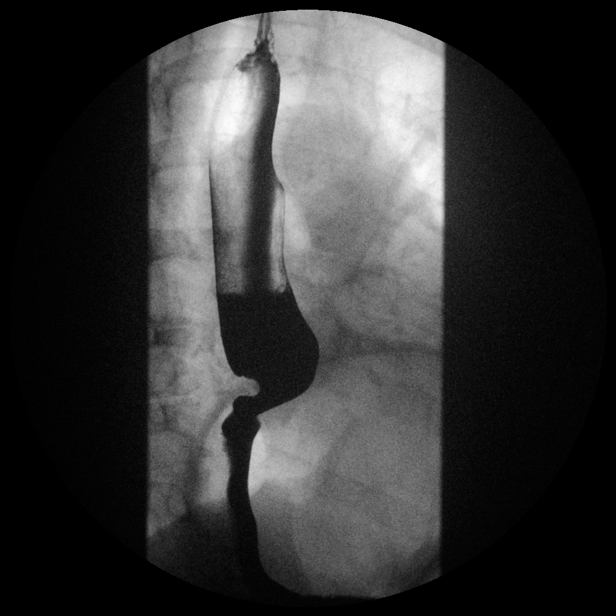

[Series 7: run · 1 of 1 slices shown (4 of 14)]
[im 1/1]
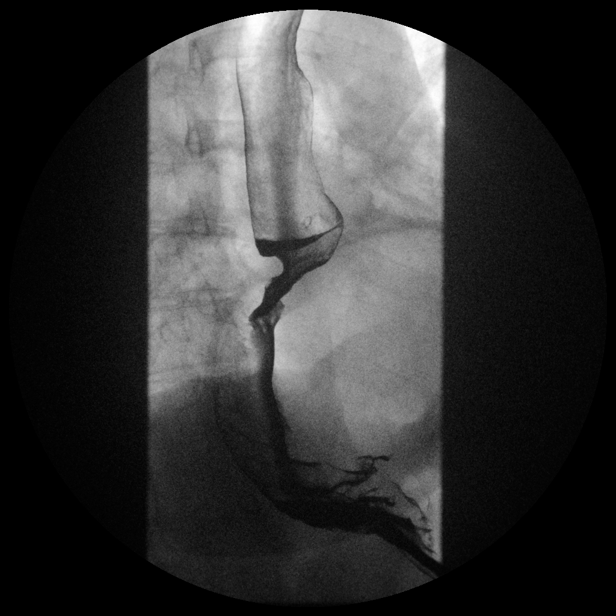

[Series 8: run · 1 of 1 slices shown (5 of 14)]
[im 1/1]
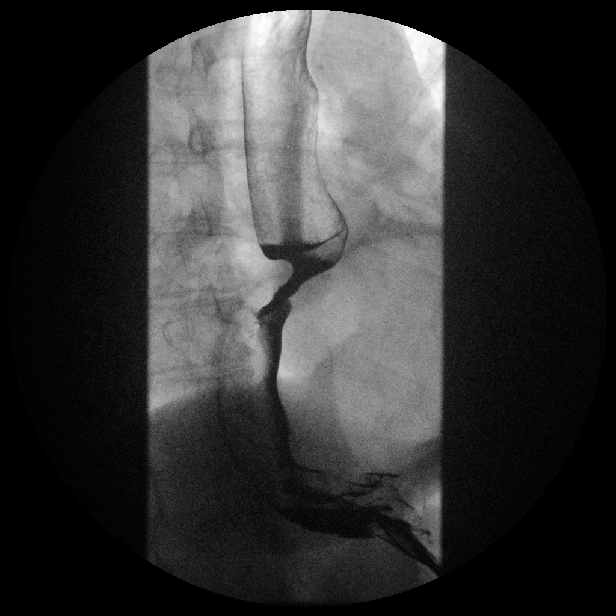

[Series 10: run · 1 of 1 slices shown (6 of 14)]
[im 1/1]
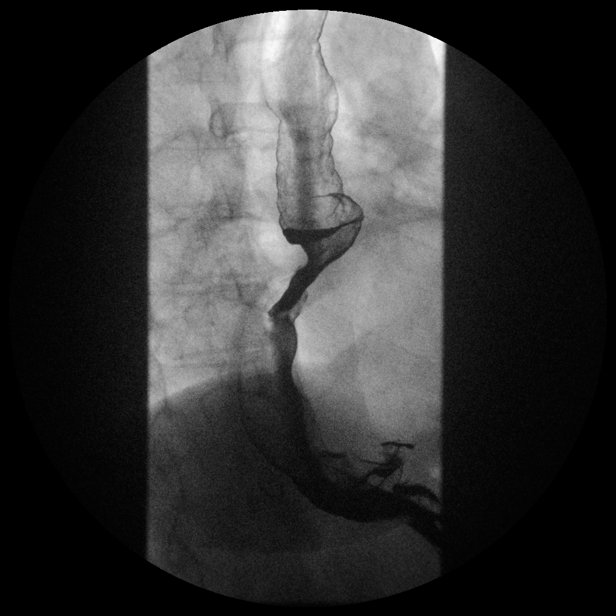

[Series 12: run · 1 of 1 slices shown (7 of 14)]
[im 1/1]
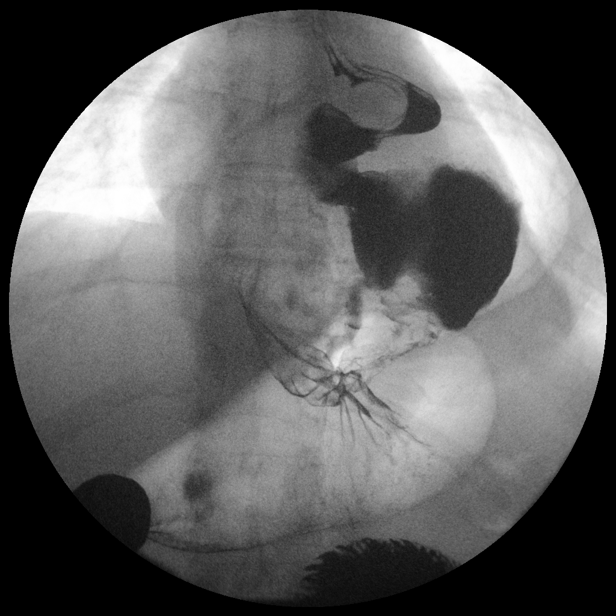

[Series 13: run · 1 of 1 slices shown (8 of 14)]
[im 1/1]
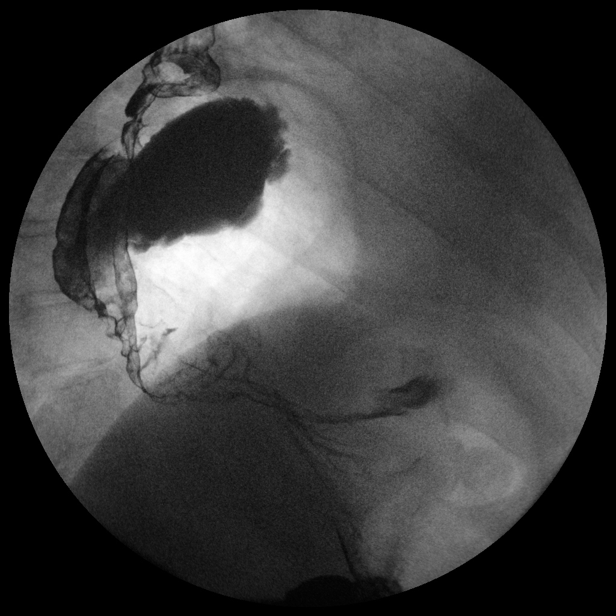

[Series 15: run · 1 of 1 slices shown (9 of 14)]
[im 1/1]
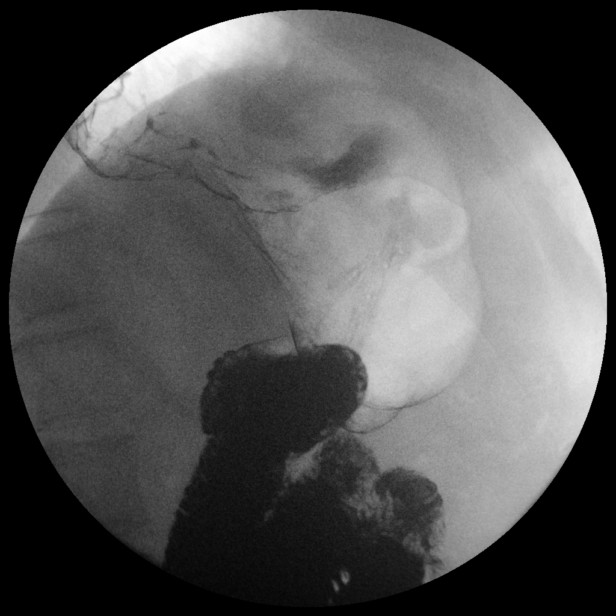

[Series 17: run · 1 of 1 slices shown (10 of 14)]
[im 1/1]
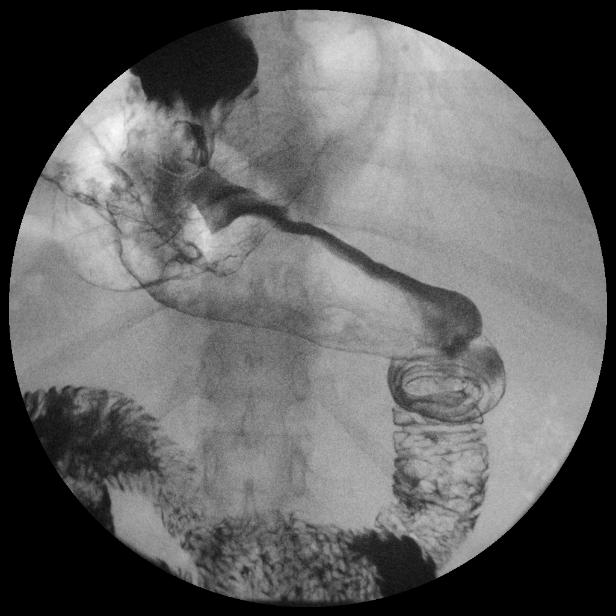

[Series 19: run · 1 of 1 slices shown (11 of 14)]
[im 1/1]
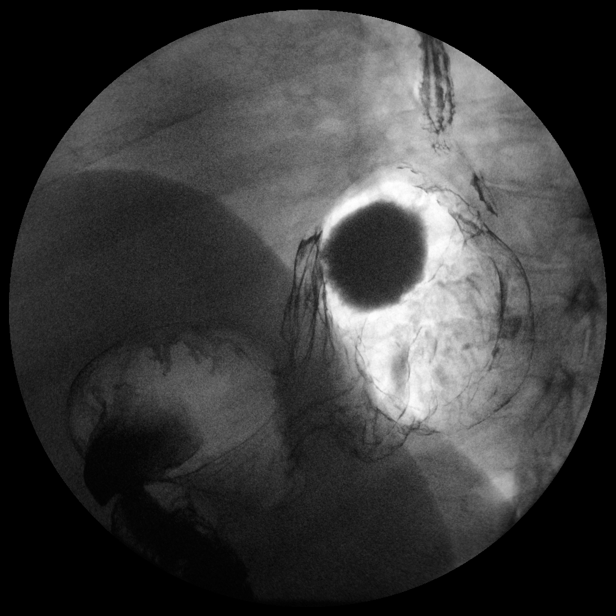

[Series 20: run · 1 of 1 slices shown (12 of 14)]
[im 1/1]
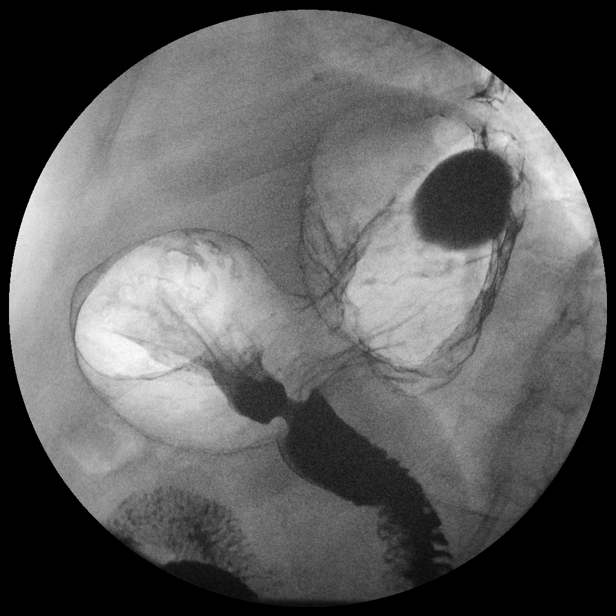

[Series 22: run · 1 of 1 slices shown (13 of 14)]
[im 1/1]
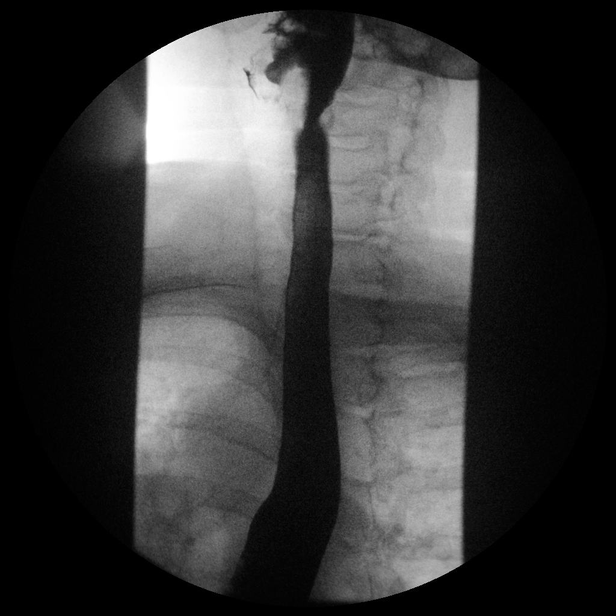

[Series 24: run · 1 of 1 slices shown (14 of 14)]
[im 1/1]
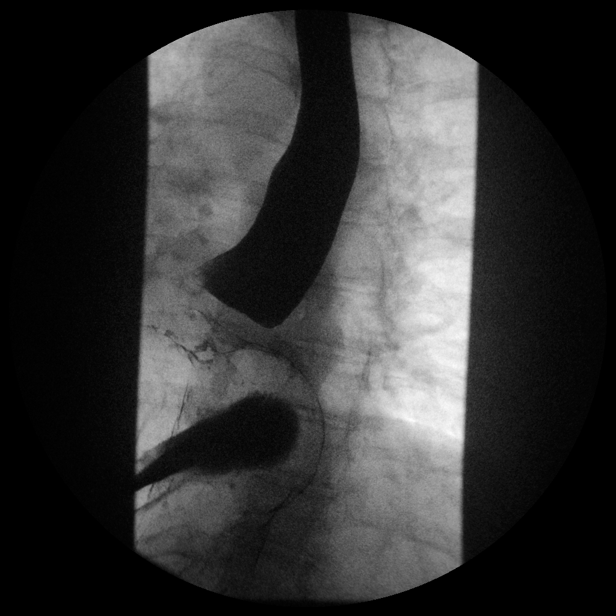

[14 of 24 positions shown; findings below may reference images not displayed]

FINDINGS: Examination of the esophagus demonstrated normal esophageal
motility. Normal esophageal morphology without evidence of
esophagitis or ulceration. No esophageal stricture, diverticula, or
mass lesion. There is mild gastroesophageal reflux. There is a large
hiatal hernia.

Examination of the stomach demonstrated normal rugal folds and areae
gastricae. The gastric mucosa appeared unremarkable without evidence
of ulceration, scarring, or mass lesion. Gastric motility and
emptying was normal. Fluoroscopic examination of the duodenum
demonstrates normal motility and morphology without evidence of
ulceration or mass lesion.

At the end of the examination a 13 mm barium tablet was administered
which transited through the esophagus and esophagogastric junction
without delay.
IMPRESSION: 1. Large hiatal hernia.
2. Mild gastroesophageal reflux.

## 2016-09-26 DIAGNOSIS — Z23 Encounter for immunization: Secondary | ICD-10-CM | POA: Diagnosis not present

## 2016-11-01 DIAGNOSIS — E7800 Pure hypercholesterolemia, unspecified: Secondary | ICD-10-CM | POA: Diagnosis not present

## 2017-04-14 DIAGNOSIS — Z1231 Encounter for screening mammogram for malignant neoplasm of breast: Secondary | ICD-10-CM | POA: Diagnosis not present

## 2017-05-11 DIAGNOSIS — E7800 Pure hypercholesterolemia, unspecified: Secondary | ICD-10-CM | POA: Diagnosis not present

## 2017-05-11 DIAGNOSIS — K219 Gastro-esophageal reflux disease without esophagitis: Secondary | ICD-10-CM | POA: Diagnosis not present

## 2017-05-11 DIAGNOSIS — G2581 Restless legs syndrome: Secondary | ICD-10-CM | POA: Diagnosis not present

## 2017-05-11 DIAGNOSIS — Z1211 Encounter for screening for malignant neoplasm of colon: Secondary | ICD-10-CM | POA: Diagnosis not present

## 2017-05-11 DIAGNOSIS — Z0000 Encounter for general adult medical examination without abnormal findings: Secondary | ICD-10-CM | POA: Diagnosis not present

## 2017-05-11 DIAGNOSIS — Z79899 Other long term (current) drug therapy: Secondary | ICD-10-CM | POA: Diagnosis not present

## 2017-05-11 DIAGNOSIS — E663 Overweight: Secondary | ICD-10-CM | POA: Diagnosis not present

## 2017-05-11 DIAGNOSIS — M8580 Other specified disorders of bone density and structure, unspecified site: Secondary | ICD-10-CM | POA: Diagnosis not present

## 2017-05-11 DIAGNOSIS — Z90710 Acquired absence of both cervix and uterus: Secondary | ICD-10-CM | POA: Diagnosis not present

## 2017-05-11 DIAGNOSIS — Z7189 Other specified counseling: Secondary | ICD-10-CM | POA: Diagnosis not present

## 2017-05-11 DIAGNOSIS — Z1389 Encounter for screening for other disorder: Secondary | ICD-10-CM | POA: Diagnosis not present

## 2017-05-11 DIAGNOSIS — Z299 Encounter for prophylactic measures, unspecified: Secondary | ICD-10-CM | POA: Diagnosis not present

## 2017-06-29 DIAGNOSIS — E2839 Other primary ovarian failure: Secondary | ICD-10-CM | POA: Diagnosis not present

## 2017-06-29 DIAGNOSIS — M8580 Other specified disorders of bone density and structure, unspecified site: Secondary | ICD-10-CM | POA: Diagnosis not present

## 2017-09-01 DIAGNOSIS — E7800 Pure hypercholesterolemia, unspecified: Secondary | ICD-10-CM | POA: Diagnosis not present

## 2017-09-01 DIAGNOSIS — Z789 Other specified health status: Secondary | ICD-10-CM | POA: Diagnosis not present

## 2017-09-01 DIAGNOSIS — E663 Overweight: Secondary | ICD-10-CM | POA: Diagnosis not present

## 2017-09-01 DIAGNOSIS — G2581 Restless legs syndrome: Secondary | ICD-10-CM | POA: Diagnosis not present

## 2017-09-01 DIAGNOSIS — Z299 Encounter for prophylactic measures, unspecified: Secondary | ICD-10-CM | POA: Diagnosis not present

## 2017-09-19 DIAGNOSIS — Z23 Encounter for immunization: Secondary | ICD-10-CM | POA: Diagnosis not present

## 2018-01-16 DIAGNOSIS — Z6829 Body mass index (BMI) 29.0-29.9, adult: Secondary | ICD-10-CM | POA: Diagnosis not present

## 2018-01-16 DIAGNOSIS — Z789 Other specified health status: Secondary | ICD-10-CM | POA: Diagnosis not present

## 2018-01-16 DIAGNOSIS — Z299 Encounter for prophylactic measures, unspecified: Secondary | ICD-10-CM | POA: Diagnosis not present

## 2018-01-16 DIAGNOSIS — Z713 Dietary counseling and surveillance: Secondary | ICD-10-CM | POA: Diagnosis not present

## 2018-01-16 DIAGNOSIS — J320 Chronic maxillary sinusitis: Secondary | ICD-10-CM | POA: Diagnosis not present

## 2018-04-20 DIAGNOSIS — Z1231 Encounter for screening mammogram for malignant neoplasm of breast: Secondary | ICD-10-CM | POA: Diagnosis not present

## 2018-06-28 DIAGNOSIS — Z1331 Encounter for screening for depression: Secondary | ICD-10-CM | POA: Diagnosis not present

## 2018-06-28 DIAGNOSIS — Z79899 Other long term (current) drug therapy: Secondary | ICD-10-CM | POA: Diagnosis not present

## 2018-06-28 DIAGNOSIS — D649 Anemia, unspecified: Secondary | ICD-10-CM | POA: Diagnosis not present

## 2018-06-28 DIAGNOSIS — Z1339 Encounter for screening examination for other mental health and behavioral disorders: Secondary | ICD-10-CM | POA: Diagnosis not present

## 2018-06-28 DIAGNOSIS — E7800 Pure hypercholesterolemia, unspecified: Secondary | ICD-10-CM | POA: Diagnosis not present

## 2018-06-28 DIAGNOSIS — Z0000 Encounter for general adult medical examination without abnormal findings: Secondary | ICD-10-CM | POA: Diagnosis not present

## 2018-06-28 DIAGNOSIS — Z7189 Other specified counseling: Secondary | ICD-10-CM | POA: Diagnosis not present

## 2018-06-28 DIAGNOSIS — R5383 Other fatigue: Secondary | ICD-10-CM | POA: Diagnosis not present

## 2018-06-28 DIAGNOSIS — Z6826 Body mass index (BMI) 26.0-26.9, adult: Secondary | ICD-10-CM | POA: Diagnosis not present

## 2018-06-28 DIAGNOSIS — Z1211 Encounter for screening for malignant neoplasm of colon: Secondary | ICD-10-CM | POA: Diagnosis not present

## 2018-06-28 DIAGNOSIS — Z299 Encounter for prophylactic measures, unspecified: Secondary | ICD-10-CM | POA: Diagnosis not present

## 2018-09-18 DIAGNOSIS — Z23 Encounter for immunization: Secondary | ICD-10-CM | POA: Diagnosis not present

## 2018-11-12 DIAGNOSIS — H524 Presbyopia: Secondary | ICD-10-CM | POA: Diagnosis not present

## 2018-11-12 DIAGNOSIS — H353110 Nonexudative age-related macular degeneration, right eye, stage unspecified: Secondary | ICD-10-CM | POA: Diagnosis not present

## 2019-01-31 DIAGNOSIS — H2512 Age-related nuclear cataract, left eye: Secondary | ICD-10-CM | POA: Diagnosis not present

## 2019-01-31 DIAGNOSIS — H35362 Drusen (degenerative) of macula, left eye: Secondary | ICD-10-CM | POA: Diagnosis not present

## 2019-01-31 DIAGNOSIS — H25012 Cortical age-related cataract, left eye: Secondary | ICD-10-CM | POA: Diagnosis not present

## 2019-01-31 DIAGNOSIS — H35033 Hypertensive retinopathy, bilateral: Secondary | ICD-10-CM | POA: Diagnosis not present

## 2019-01-31 DIAGNOSIS — H2513 Age-related nuclear cataract, bilateral: Secondary | ICD-10-CM | POA: Diagnosis not present

## 2019-01-31 DIAGNOSIS — H25013 Cortical age-related cataract, bilateral: Secondary | ICD-10-CM | POA: Diagnosis not present

## 2019-02-12 DIAGNOSIS — H2512 Age-related nuclear cataract, left eye: Secondary | ICD-10-CM | POA: Diagnosis not present

## 2019-02-12 DIAGNOSIS — H25812 Combined forms of age-related cataract, left eye: Secondary | ICD-10-CM | POA: Diagnosis not present

## 2019-05-07 DIAGNOSIS — H268 Other specified cataract: Secondary | ICD-10-CM | POA: Diagnosis not present

## 2019-05-07 DIAGNOSIS — H25011 Cortical age-related cataract, right eye: Secondary | ICD-10-CM | POA: Diagnosis not present

## 2019-05-07 DIAGNOSIS — H2511 Age-related nuclear cataract, right eye: Secondary | ICD-10-CM | POA: Diagnosis not present

## 2019-05-13 DIAGNOSIS — H2511 Age-related nuclear cataract, right eye: Secondary | ICD-10-CM | POA: Diagnosis not present

## 2019-07-05 DIAGNOSIS — Z7189 Other specified counseling: Secondary | ICD-10-CM | POA: Diagnosis not present

## 2019-07-05 DIAGNOSIS — Z1331 Encounter for screening for depression: Secondary | ICD-10-CM | POA: Diagnosis not present

## 2019-07-05 DIAGNOSIS — Z0000 Encounter for general adult medical examination without abnormal findings: Secondary | ICD-10-CM | POA: Diagnosis not present

## 2019-07-05 DIAGNOSIS — E559 Vitamin D deficiency, unspecified: Secondary | ICD-10-CM | POA: Diagnosis not present

## 2019-07-05 DIAGNOSIS — Z79899 Other long term (current) drug therapy: Secondary | ICD-10-CM | POA: Diagnosis not present

## 2019-07-05 DIAGNOSIS — Z1211 Encounter for screening for malignant neoplasm of colon: Secondary | ICD-10-CM | POA: Diagnosis not present

## 2019-07-05 DIAGNOSIS — D649 Anemia, unspecified: Secondary | ICD-10-CM | POA: Diagnosis not present

## 2019-07-05 DIAGNOSIS — Z1339 Encounter for screening examination for other mental health and behavioral disorders: Secondary | ICD-10-CM | POA: Diagnosis not present

## 2019-07-05 DIAGNOSIS — Z299 Encounter for prophylactic measures, unspecified: Secondary | ICD-10-CM | POA: Diagnosis not present

## 2019-07-05 DIAGNOSIS — R5383 Other fatigue: Secondary | ICD-10-CM | POA: Diagnosis not present

## 2019-07-05 DIAGNOSIS — E7800 Pure hypercholesterolemia, unspecified: Secondary | ICD-10-CM | POA: Diagnosis not present

## 2019-07-05 DIAGNOSIS — Z6826 Body mass index (BMI) 26.0-26.9, adult: Secondary | ICD-10-CM | POA: Diagnosis not present

## 2019-07-22 DIAGNOSIS — M859 Disorder of bone density and structure, unspecified: Secondary | ICD-10-CM | POA: Diagnosis not present

## 2019-07-22 DIAGNOSIS — E2839 Other primary ovarian failure: Secondary | ICD-10-CM | POA: Diagnosis not present

## 2019-07-30 DIAGNOSIS — Z1231 Encounter for screening mammogram for malignant neoplasm of breast: Secondary | ICD-10-CM | POA: Diagnosis not present

## 2019-10-07 DIAGNOSIS — Z23 Encounter for immunization: Secondary | ICD-10-CM | POA: Diagnosis not present

## 2020-01-07 ENCOUNTER — Ambulatory Visit: Payer: Medicare Other

## 2020-01-16 ENCOUNTER — Ambulatory Visit: Payer: Medicare Other | Attending: Internal Medicine

## 2020-01-16 DIAGNOSIS — Z23 Encounter for immunization: Secondary | ICD-10-CM | POA: Insufficient documentation

## 2020-01-16 NOTE — Progress Notes (Signed)
   Covid-19 Vaccination Clinic  Name:  Beth Suarez    MRN: [id number] DOB: [birth year]  01/16/2020  Beth Suarez was observed post Covid-19 immunization for 15 minutes without incidence. She was provided with Vaccine Information Sheet and instruction to access the V-Safe system.   Beth Suarez was instructed to call 911 with any severe reactions post vaccine: [name] Kitchen Difficulty breathing  . Swelling of your face and throat  . A fast heartbeat  . A bad rash all over your body  . Dizziness and weakness    Immunizations Administered    Name Date Dose VIS Date Route   Pfizer COVID-19 Vaccine 01/16/2020  1:07 PM 0.3 mL 11/22/2019 Intramuscular   Manufacturer: [place]   Lot: [id number]   [facility]: [id number]

## 2020-01-28 ENCOUNTER — Ambulatory Visit: Payer: Medicare Other

## 2020-02-11 ENCOUNTER — Ambulatory Visit: Payer: Medicare Other | Attending: Internal Medicine

## 2020-02-11 DIAGNOSIS — Z23 Encounter for immunization: Secondary | ICD-10-CM | POA: Insufficient documentation

## 2020-02-11 NOTE — Progress Notes (Signed)
   Covid-19 Vaccination Clinic  Name:  Beth Suarez    MRN: [id number] DOB: [birth year]  02/11/2020  Beth Suarez was observed post Covid-19 immunization for 15 minutes without incident. She was provided with Vaccine Information Sheet and instruction to access the V-Safe system.   Beth Suarez was instructed to call 911 with any severe reactions post vaccine: [name] Kitchen Difficulty breathing  . Swelling of face and throat  . A fast heartbeat  . A bad rash all over body  . Dizziness and weakness   Immunizations Administered    Name Date Dose VIS Date Route   Pfizer COVID-19 Vaccine 02/11/2020  1:06 PM 0.3 mL 11/22/2019 Intramuscular   Manufacturer: [place]   Lot: [id number]   [place]: [id number]

## 2020-04-14 DIAGNOSIS — Z299 Encounter for prophylactic measures, unspecified: Secondary | ICD-10-CM | POA: Diagnosis not present

## 2020-04-14 DIAGNOSIS — Z789 Other specified health status: Secondary | ICD-10-CM | POA: Diagnosis not present

## 2020-04-14 DIAGNOSIS — J329 Chronic sinusitis, unspecified: Secondary | ICD-10-CM | POA: Diagnosis not present

## 2020-04-14 DIAGNOSIS — K219 Gastro-esophageal reflux disease without esophagitis: Secondary | ICD-10-CM | POA: Diagnosis not present

## 2020-06-25 DIAGNOSIS — H26492 Other secondary cataract, left eye: Secondary | ICD-10-CM | POA: Diagnosis not present

## 2020-07-16 DIAGNOSIS — Z6825 Body mass index (BMI) 25.0-25.9, adult: Secondary | ICD-10-CM | POA: Diagnosis not present

## 2020-07-16 DIAGNOSIS — Z0000 Encounter for general adult medical examination without abnormal findings: Secondary | ICD-10-CM | POA: Diagnosis not present

## 2020-07-16 DIAGNOSIS — Z299 Encounter for prophylactic measures, unspecified: Secondary | ICD-10-CM | POA: Diagnosis not present

## 2020-07-16 DIAGNOSIS — Z79899 Other long term (current) drug therapy: Secondary | ICD-10-CM | POA: Diagnosis not present

## 2020-07-16 DIAGNOSIS — R5383 Other fatigue: Secondary | ICD-10-CM | POA: Diagnosis not present

## 2020-07-16 DIAGNOSIS — Z7189 Other specified counseling: Secondary | ICD-10-CM | POA: Diagnosis not present

## 2020-07-16 DIAGNOSIS — E7800 Pure hypercholesterolemia, unspecified: Secondary | ICD-10-CM | POA: Diagnosis not present

## 2020-07-16 DIAGNOSIS — Z1339 Encounter for screening examination for other mental health and behavioral disorders: Secondary | ICD-10-CM | POA: Diagnosis not present

## 2020-07-16 DIAGNOSIS — Z1331 Encounter for screening for depression: Secondary | ICD-10-CM | POA: Diagnosis not present

## 2020-08-10 DIAGNOSIS — Z1231 Encounter for screening mammogram for malignant neoplasm of breast: Secondary | ICD-10-CM | POA: Diagnosis not present

## 2020-10-27 DIAGNOSIS — Z23 Encounter for immunization: Secondary | ICD-10-CM | POA: Diagnosis not present

## 2020-11-24 DIAGNOSIS — H04123 Dry eye syndrome of bilateral lacrimal glands: Secondary | ICD-10-CM | POA: Diagnosis not present

## 2021-08-05 DIAGNOSIS — R5383 Other fatigue: Secondary | ICD-10-CM | POA: Diagnosis not present

## 2021-08-05 DIAGNOSIS — Z0000 Encounter for general adult medical examination without abnormal findings: Secondary | ICD-10-CM | POA: Diagnosis not present

## 2021-08-05 DIAGNOSIS — Z1339 Encounter for screening examination for other mental health and behavioral disorders: Secondary | ICD-10-CM | POA: Diagnosis not present

## 2021-08-05 DIAGNOSIS — Z6826 Body mass index (BMI) 26.0-26.9, adult: Secondary | ICD-10-CM | POA: Diagnosis not present

## 2021-08-05 DIAGNOSIS — Z79899 Other long term (current) drug therapy: Secondary | ICD-10-CM | POA: Diagnosis not present

## 2021-08-05 DIAGNOSIS — Z1331 Encounter for screening for depression: Secondary | ICD-10-CM | POA: Diagnosis not present

## 2021-08-05 DIAGNOSIS — E7800 Pure hypercholesterolemia, unspecified: Secondary | ICD-10-CM | POA: Diagnosis not present

## 2021-08-05 DIAGNOSIS — Z7189 Other specified counseling: Secondary | ICD-10-CM | POA: Diagnosis not present

## 2021-08-05 DIAGNOSIS — Z299 Encounter for prophylactic measures, unspecified: Secondary | ICD-10-CM | POA: Diagnosis not present

## 2021-08-05 DIAGNOSIS — E8940 Asymptomatic postprocedural ovarian failure: Secondary | ICD-10-CM | POA: Diagnosis not present

## 2021-08-05 DIAGNOSIS — N183 Chronic kidney disease, stage 3 unspecified: Secondary | ICD-10-CM | POA: Diagnosis not present

## 2021-08-30 DIAGNOSIS — Z79899 Other long term (current) drug therapy: Secondary | ICD-10-CM | POA: Diagnosis not present

## 2021-08-30 DIAGNOSIS — M818 Other osteoporosis without current pathological fracture: Secondary | ICD-10-CM | POA: Diagnosis not present

## 2021-08-30 DIAGNOSIS — M859 Disorder of bone density and structure, unspecified: Secondary | ICD-10-CM | POA: Diagnosis not present

## 2021-08-30 DIAGNOSIS — E2839 Other primary ovarian failure: Secondary | ICD-10-CM | POA: Diagnosis not present

## 2021-09-22 DIAGNOSIS — Z23 Encounter for immunization: Secondary | ICD-10-CM | POA: Diagnosis not present

## 2021-09-22 DIAGNOSIS — Z20828 Contact with and (suspected) exposure to other viral communicable diseases: Secondary | ICD-10-CM | POA: Diagnosis not present

## 2021-10-27 DIAGNOSIS — R5383 Other fatigue: Secondary | ICD-10-CM | POA: Diagnosis not present

## 2021-10-27 DIAGNOSIS — Z299 Encounter for prophylactic measures, unspecified: Secondary | ICD-10-CM | POA: Diagnosis not present

## 2021-10-27 DIAGNOSIS — M810 Age-related osteoporosis without current pathological fracture: Secondary | ICD-10-CM | POA: Diagnosis not present

## 2021-10-27 DIAGNOSIS — M8580 Other specified disorders of bone density and structure, unspecified site: Secondary | ICD-10-CM | POA: Diagnosis not present

## 2021-10-27 DIAGNOSIS — J069 Acute upper respiratory infection, unspecified: Secondary | ICD-10-CM | POA: Diagnosis not present

## 2021-11-12 DIAGNOSIS — U071 COVID-19: Secondary | ICD-10-CM | POA: Diagnosis not present

## 2021-11-12 DIAGNOSIS — J069 Acute upper respiratory infection, unspecified: Secondary | ICD-10-CM | POA: Diagnosis not present

## 2021-11-12 DIAGNOSIS — Z299 Encounter for prophylactic measures, unspecified: Secondary | ICD-10-CM | POA: Diagnosis not present

## 2021-11-12 DIAGNOSIS — R6889 Other general symptoms and signs: Secondary | ICD-10-CM | POA: Diagnosis not present

## 2022-01-07 DIAGNOSIS — Z1231 Encounter for screening mammogram for malignant neoplasm of breast: Secondary | ICD-10-CM | POA: Diagnosis not present

## 2022-08-09 DIAGNOSIS — M8580 Other specified disorders of bone density and structure, unspecified site: Secondary | ICD-10-CM | POA: Diagnosis not present

## 2022-08-09 DIAGNOSIS — Z6827 Body mass index (BMI) 27.0-27.9, adult: Secondary | ICD-10-CM | POA: Diagnosis not present

## 2022-08-09 DIAGNOSIS — Z1331 Encounter for screening for depression: Secondary | ICD-10-CM | POA: Diagnosis not present

## 2022-08-09 DIAGNOSIS — Z299 Encounter for prophylactic measures, unspecified: Secondary | ICD-10-CM | POA: Diagnosis not present

## 2022-08-09 DIAGNOSIS — R5383 Other fatigue: Secondary | ICD-10-CM | POA: Diagnosis not present

## 2022-08-09 DIAGNOSIS — Z7189 Other specified counseling: Secondary | ICD-10-CM | POA: Diagnosis not present

## 2022-08-09 DIAGNOSIS — Z789 Other specified health status: Secondary | ICD-10-CM | POA: Diagnosis not present

## 2022-08-09 DIAGNOSIS — Z1339 Encounter for screening examination for other mental health and behavioral disorders: Secondary | ICD-10-CM | POA: Diagnosis not present

## 2022-08-09 DIAGNOSIS — Z79899 Other long term (current) drug therapy: Secondary | ICD-10-CM | POA: Diagnosis not present

## 2022-08-09 DIAGNOSIS — Z0000 Encounter for general adult medical examination without abnormal findings: Secondary | ICD-10-CM | POA: Diagnosis not present

## 2022-08-09 DIAGNOSIS — E7800 Pure hypercholesterolemia, unspecified: Secondary | ICD-10-CM | POA: Diagnosis not present

## 2022-09-28 DIAGNOSIS — Z23 Encounter for immunization: Secondary | ICD-10-CM | POA: Diagnosis not present

## 2023-03-20 DIAGNOSIS — Z1231 Encounter for screening mammogram for malignant neoplasm of breast: Secondary | ICD-10-CM | POA: Diagnosis not present

## 2023-04-03 DIAGNOSIS — Z299 Encounter for prophylactic measures, unspecified: Secondary | ICD-10-CM | POA: Diagnosis not present

## 2023-04-03 DIAGNOSIS — N183 Chronic kidney disease, stage 3 unspecified: Secondary | ICD-10-CM | POA: Diagnosis not present

## 2023-04-03 DIAGNOSIS — K2970 Gastritis, unspecified, without bleeding: Secondary | ICD-10-CM | POA: Diagnosis not present

## 2023-08-15 DIAGNOSIS — M8580 Other specified disorders of bone density and structure, unspecified site: Secondary | ICD-10-CM | POA: Diagnosis not present

## 2023-08-15 DIAGNOSIS — Z299 Encounter for prophylactic measures, unspecified: Secondary | ICD-10-CM | POA: Diagnosis not present

## 2023-08-15 DIAGNOSIS — R5383 Other fatigue: Secondary | ICD-10-CM | POA: Diagnosis not present

## 2023-08-15 DIAGNOSIS — E7800 Pure hypercholesterolemia, unspecified: Secondary | ICD-10-CM | POA: Diagnosis not present

## 2023-08-15 DIAGNOSIS — Z1339 Encounter for screening examination for other mental health and behavioral disorders: Secondary | ICD-10-CM | POA: Diagnosis not present

## 2023-08-15 DIAGNOSIS — Z7189 Other specified counseling: Secondary | ICD-10-CM | POA: Diagnosis not present

## 2023-08-15 DIAGNOSIS — Z1331 Encounter for screening for depression: Secondary | ICD-10-CM | POA: Diagnosis not present

## 2023-08-15 DIAGNOSIS — Z79899 Other long term (current) drug therapy: Secondary | ICD-10-CM | POA: Diagnosis not present

## 2023-08-15 DIAGNOSIS — Z0000 Encounter for general adult medical examination without abnormal findings: Secondary | ICD-10-CM | POA: Diagnosis not present

## 2023-09-19 DIAGNOSIS — E2839 Other primary ovarian failure: Secondary | ICD-10-CM | POA: Diagnosis not present

## 2023-09-19 DIAGNOSIS — Z79899 Other long term (current) drug therapy: Secondary | ICD-10-CM | POA: Diagnosis not present

## 2023-09-19 DIAGNOSIS — M818 Other osteoporosis without current pathological fracture: Secondary | ICD-10-CM | POA: Diagnosis not present

## 2023-09-27 DIAGNOSIS — Z23 Encounter for immunization: Secondary | ICD-10-CM | POA: Diagnosis not present

## 2023-10-17 DIAGNOSIS — N183 Chronic kidney disease, stage 3 unspecified: Secondary | ICD-10-CM | POA: Diagnosis not present

## 2023-10-17 DIAGNOSIS — Z299 Encounter for prophylactic measures, unspecified: Secondary | ICD-10-CM | POA: Diagnosis not present

## 2023-10-17 DIAGNOSIS — M810 Age-related osteoporosis without current pathological fracture: Secondary | ICD-10-CM | POA: Diagnosis not present

## 2023-11-07 DIAGNOSIS — J069 Acute upper respiratory infection, unspecified: Secondary | ICD-10-CM | POA: Diagnosis not present

## 2023-11-07 DIAGNOSIS — Z299 Encounter for prophylactic measures, unspecified: Secondary | ICD-10-CM | POA: Diagnosis not present

## 2023-11-07 DIAGNOSIS — N183 Chronic kidney disease, stage 3 unspecified: Secondary | ICD-10-CM | POA: Diagnosis not present

## 2023-11-07 DIAGNOSIS — R0981 Nasal congestion: Secondary | ICD-10-CM | POA: Diagnosis not present

## 2023-11-07 DIAGNOSIS — J029 Acute pharyngitis, unspecified: Secondary | ICD-10-CM | POA: Diagnosis not present

## 2023-12-08 DIAGNOSIS — N183 Chronic kidney disease, stage 3 unspecified: Secondary | ICD-10-CM | POA: Diagnosis not present

## 2023-12-08 DIAGNOSIS — J069 Acute upper respiratory infection, unspecified: Secondary | ICD-10-CM | POA: Diagnosis not present

## 2023-12-08 DIAGNOSIS — Z299 Encounter for prophylactic measures, unspecified: Secondary | ICD-10-CM | POA: Diagnosis not present

## 2023-12-08 DIAGNOSIS — R058 Other specified cough: Secondary | ICD-10-CM | POA: Diagnosis not present

## 2024-01-08 DIAGNOSIS — R0981 Nasal congestion: Secondary | ICD-10-CM | POA: Diagnosis not present

## 2024-01-08 DIAGNOSIS — J111 Influenza due to unidentified influenza virus with other respiratory manifestations: Secondary | ICD-10-CM | POA: Diagnosis not present

## 2024-01-08 DIAGNOSIS — Z299 Encounter for prophylactic measures, unspecified: Secondary | ICD-10-CM | POA: Diagnosis not present

## 2024-01-08 DIAGNOSIS — R059 Cough, unspecified: Secondary | ICD-10-CM | POA: Diagnosis not present

## 2024-01-08 DIAGNOSIS — N183 Chronic kidney disease, stage 3 unspecified: Secondary | ICD-10-CM | POA: Diagnosis not present

## 2024-04-12 DIAGNOSIS — J069 Acute upper respiratory infection, unspecified: Secondary | ICD-10-CM | POA: Diagnosis not present

## 2024-04-12 DIAGNOSIS — Z299 Encounter for prophylactic measures, unspecified: Secondary | ICD-10-CM | POA: Diagnosis not present

## 2024-04-12 DIAGNOSIS — R5383 Other fatigue: Secondary | ICD-10-CM | POA: Diagnosis not present

## 2024-06-13 DIAGNOSIS — Z1231 Encounter for screening mammogram for malignant neoplasm of breast: Secondary | ICD-10-CM | POA: Diagnosis not present

## 2024-08-15 DIAGNOSIS — Z299 Encounter for prophylactic measures, unspecified: Secondary | ICD-10-CM | POA: Diagnosis not present

## 2024-08-15 DIAGNOSIS — Z79899 Other long term (current) drug therapy: Secondary | ICD-10-CM | POA: Diagnosis not present

## 2024-08-15 DIAGNOSIS — M810 Age-related osteoporosis without current pathological fracture: Secondary | ICD-10-CM | POA: Diagnosis not present

## 2024-08-15 DIAGNOSIS — K649 Unspecified hemorrhoids: Secondary | ICD-10-CM | POA: Diagnosis not present

## 2024-08-15 DIAGNOSIS — Z7189 Other specified counseling: Secondary | ICD-10-CM | POA: Diagnosis not present

## 2024-08-15 DIAGNOSIS — E7800 Pure hypercholesterolemia, unspecified: Secondary | ICD-10-CM | POA: Diagnosis not present

## 2024-08-15 DIAGNOSIS — Z1339 Encounter for screening examination for other mental health and behavioral disorders: Secondary | ICD-10-CM | POA: Diagnosis not present

## 2024-08-15 DIAGNOSIS — Z0000 Encounter for general adult medical examination without abnormal findings: Secondary | ICD-10-CM | POA: Diagnosis not present

## 2024-08-15 DIAGNOSIS — Z1331 Encounter for screening for depression: Secondary | ICD-10-CM | POA: Diagnosis not present

## 2024-08-15 DIAGNOSIS — K469 Unspecified abdominal hernia without obstruction or gangrene: Secondary | ICD-10-CM | POA: Diagnosis not present

## 2024-08-15 DIAGNOSIS — R5383 Other fatigue: Secondary | ICD-10-CM | POA: Diagnosis not present

## 2024-09-05 ENCOUNTER — Other Ambulatory Visit (HOSPITAL_COMMUNITY): Payer: Self-pay | Admitting: Surgery

## 2024-09-05 DIAGNOSIS — K432 Incisional hernia without obstruction or gangrene: Secondary | ICD-10-CM

## 2024-09-06 ENCOUNTER — Ambulatory Visit (HOSPITAL_COMMUNITY)
Admission: RE | Admit: 2024-09-06 | Discharge: 2024-09-06 | Disposition: A | Discharge status: Home / Self Care | Source: Ambulatory Visit | Attending: Surgery | Admitting: Surgery

## 2024-09-06 DIAGNOSIS — K8020 Calculus of gallbladder without cholecystitis without obstruction: Secondary | ICD-10-CM | POA: Diagnosis not present

## 2024-09-06 DIAGNOSIS — K449 Diaphragmatic hernia without obstruction or gangrene: Secondary | ICD-10-CM | POA: Diagnosis not present

## 2024-09-06 DIAGNOSIS — K432 Incisional hernia without obstruction or gangrene: Secondary | ICD-10-CM | POA: Diagnosis present

## 2024-09-06 MED ORDER — IOHEXOL 300 MG/ML  SOLN
100.0000 mL | Freq: Once | INTRAMUSCULAR | Status: AC | PRN
  Administered 2024-09-06: 100 mL via INTRAVENOUS

## 2024-09-18 DIAGNOSIS — Z23 Encounter for immunization: Secondary | ICD-10-CM | POA: Diagnosis not present

## 2024-09-23 NOTE — Progress Notes (Signed)
  Date of COVID positive in last 90 days:  PCP - Leta Fear, MD Cardiologist - Sheppard Sierras, MD (2016)  Chest x-ray - N/A EKG - N/A Stress Test - 05-05-15 Epic ECHO - N/A Cardiac Cath -  Pacemaker/ICD device last checked:N/A Spinal Cord Stimulator:N/A  Bowel Prep - N/A  Sleep Study - N/A CPAP -   Fasting Blood Sugar - N/A Checks Blood Sugar _____ times a day  Last dose of GLP1 agonist-  N/A GLP1 instructions:  Do not take after     Last dose of SGLT-2 inhibitors-  N/A SGLT-2 instructions:  Do not take after     Blood Thinner Instructions: N/A Last dose:   Time: Aspirin Instructions:N/A Last Dose:  Activity level:  Can go up a flight of stairs and perform activities of daily living without stopping and without symptoms of chest pain or shortness of breath.  Able to exercise without symptoms  Unable to go up a flight of stairs without symptoms of     Anesthesia review: N/A  Patient denies shortness of breath, fever, cough and chest pain at PAT appointment  Patient verbalized understanding of instructions that were given to them at the PAT appointment. Patient was also instructed that they will need to review over the PAT instructions again at home before surgery.

## 2024-09-23 NOTE — Progress Notes (Signed)
 Surgery orders requested via Epic inbox.

## 2024-09-23 NOTE — Patient Instructions (Signed)
 SURGICAL WAITING ROOM VISITATION Patients having surgery or a procedure may have no more than 2 support people in the waiting area - these visitors may rotate.    Children under the age of 26 must have an adult with them who is not the patient.  If the patient needs to stay at the hospital during part of their recovery, the visitor guidelines for inpatient rooms apply. Pre-op nurse will coordinate an appropriate time for 1 support person to accompany patient in pre-op.  This support person may not rotate.    Please refer to the [facility] website for the visitor guidelines for Inpatients (after your surgery is over and you are in a regular room).       Your procedure is scheduled on: 10-07-24   Report to [facility] Main Entrance    Report to admitting at 11:15 AM   Call this number if you have problems the morning of surgery [phone]   Do not eat food :After Midnight.   After Midnight you may have the following liquids until 10:30 AM DAY OF SURGERY  Water Non-Citrus Juices (without pulp, NO RED-Apple, White grape, White cranberry) Black Coffee (NO MILK/CREAM OR CREAMERS, sugar ok)  Clear Tea (NO MILK/CREAM OR CREAMERS, sugar ok) regular and decaf                             Plain Jell-O (NO RED)                                           Fruit ices (not with fruit pulp, NO RED)                                     Popsicles (NO RED)                                                               Sports drinks like Gatorade (NO RED)                   The day of surgery:  Drink ONE (1) Pre-Surgery Clear Ensure by 10:30 AM the morning of surgery. Drink in one sitting. Do not sip.  This drink was given to you during your hospital  pre-op appointment visit. Nothing else to drink after completing the Pre-Surgery Clear Ensure .          If you have questions, please contact your surgeon's office.   FOLLOW ANY ADDITIONAL PRE OP INSTRUCTIONS YOU RECEIVED FROM YOUR  SURGEON'S OFFICE!!!     Oral Hygiene is also important to reduce your risk of infection.                                    Remember - BRUSH YOUR TEETH THE MORNING OF SURGERY WITH YOUR REGULAR TOOTHPASTE   Do NOT smoke after Midnight   Take these medicines the morning of surgery with A SIP OF WATER:    Atorvastatin   Clonazepam  if needed  Stop all vitamins and herbal supplements 7 days before surgery                              You may not have any metal on your body including hair pins, jewelry, and body piercing             Do not wear make-up, lotions, powders, perfumes, or deodorant  Do not wear nail polish including gel and S&S, artificial/acrylic nails, or any other type of covering on natural nails including finger and toenails. If you have artificial nails, gel coating, etc. that needs to be removed by a nail salon please have this removed prior to surgery or surgery may need to be canceled/ delayed if the surgeon/ anesthesia feels like they are unable to be safely monitored.   Do not shave  48 hours prior to surgery.    Do not bring valuables to the hospital. [facility] IS NOT RESPONSIBLE   FOR VALUABLES.   Contacts, dentures or bridgework may not be worn into surgery.   Bring small overnight bag day of surgery.   DO NOT BRING YOUR HOME MEDICATIONS TO THE HOSPITAL. PHARMACY WILL DISPENSE MEDICATIONS LISTED ON YOUR MEDICATION LIST TO YOU DURING YOUR ADMISSION IN THE HOSPITAL!    Special Instructions: Bring a copy of your healthcare power of attorney and living will documents the day of surgery if you haven't scanned them before.              Please read over the following fact sheets you were given: IF YOU HAVE QUESTIONS ABOUT YOUR PRE-OP INSTRUCTIONS PLEASE CALL [phone] G[facility]  If you received a COVID test during your pre-op visit  it is requested that you wear a mask when out in public, stay away from anyone that may not be feeling well and notify your surgeon if you  develop symptoms. If you test positive for Covid or have been in contact with anyone that has tested positive in the last 10 days please notify you surgeon.  [facility] - Preparing for Surgery Before surgery, you can play an important role.  Because skin is not sterile, your skin needs to be as free of germs as possible.  You can reduce the number of germs on your skin by washing with CHG (chlorahexidine gluconate) soap before surgery.  CHG is an antiseptic cleaner which kills germs and bonds with the skin to continue killing germs even after washing. Please DO NOT use if you have an allergy to CHG or antibacterial soaps.  If your skin becomes reddened/irritated stop using the CHG and inform your nurse when you arrive at Short Stay. Do not shave (including legs and underarms) for at least 48 hours prior to the first CHG shower.  You may shave your face/neck.  Please follow these instructions carefully:  1.  Shower with CHG Soap the night before surgery ONLY (DO NOT USE THE SOAP THE MORNING OF SURGERY).  2.  If you choose to wash your hair, wash your hair first as usual with your normal  shampoo.  3.  After you shampoo, rinse your hair and body thoroughly to remove the shampoo.                             4.  Use CHG as you would any other liquid soap.  You can apply chg directly  to the skin and wash.  Gently with a scrungie or clean washcloth.  5.  Apply the CHG Soap to your body ONLY FROM THE NECK DOWN.   Do   not use on face/ open                           Wound or open sores. Avoid contact with eyes, ears mouth and   genitals (private parts).                       Wash face,  Genitals (private parts) with your normal soap.             6.  Wash thoroughly, paying special attention to the area where your    surgery  will be performed.  7.  Thoroughly rinse your body with warm water from the neck down.  8.  DO NOT shower/wash with your normal soap after using and rinsing off the CHG Soap.                 9.  Pat yourself dry with a clean towel.            10.  Wear clean pajamas.            11.  Place clean sheets on your bed the night of your first shower and do not  sleep with pets. Day of Surgery : Do not apply any CHG, lotions/deodorants the morning of surgery.  Please wear clean clothes to the hospital/surgery center.  FAILURE TO FOLLOW THESE INSTRUCTIONS MAY RESULT IN THE CANCELLATION OF YOUR SURGERY  PATIENT SIGNATURE_________________________________  NURSE SIGNATURE__________________________________

## 2024-09-24 ENCOUNTER — Encounter (HOSPITAL_COMMUNITY): Payer: Self-pay

## 2024-09-24 ENCOUNTER — Other Ambulatory Visit: Payer: Self-pay

## 2024-09-24 ENCOUNTER — Encounter (HOSPITAL_COMMUNITY)
Admission: RE | Admit: 2024-09-24 | Discharge: 2024-09-24 | Disposition: A | Discharge status: Home / Self Care | Source: Ambulatory Visit | Attending: Surgery | Admitting: Surgery

## 2024-09-24 ENCOUNTER — Ambulatory Visit: Payer: Self-pay | Admitting: Surgery

## 2024-09-24 VITALS — BP 128/90 | HR 71 | Temp 98.0°F | Resp 16 | Ht 66.0 in | Wt 158.6 lb

## 2024-09-24 DIAGNOSIS — Z01812 Encounter for preprocedural laboratory examination: Secondary | ICD-10-CM | POA: Insufficient documentation

## 2024-09-24 DIAGNOSIS — Z01818 Encounter for other preprocedural examination: Secondary | ICD-10-CM

## 2024-09-24 LAB — CBC
HCT: 41.7 % (ref 36.0–46.0)
Hemoglobin: 13.6 g/dL (ref 12.0–15.0)
MCH: 30.8 pg (ref 26.0–34.0)
MCHC: 32.6 g/dL (ref 30.0–36.0)
MCV: 94.6 fL (ref 80.0–100.0)
Platelets: 250 K/uL (ref 150–400)
RBC: 4.41 MIL/uL (ref 3.87–5.11)
RDW: 12.4 % (ref 11.5–15.5)
WBC: 7.5 K/uL (ref 4.0–10.5)
nRBC: 0 % (ref 0.0–0.2)

## 2024-10-07 ENCOUNTER — Ambulatory Visit (HOSPITAL_COMMUNITY)
Admission: RE | Admit: 2024-10-07 | Discharge: 2024-10-08 | Disposition: A | Source: Ambulatory Visit | Attending: Surgery | Admitting: Surgery

## 2024-10-07 ENCOUNTER — Ambulatory Visit (HOSPITAL_COMMUNITY): Admitting: Anesthesiology

## 2024-10-07 ENCOUNTER — Encounter (HOSPITAL_COMMUNITY): Payer: Self-pay | Admitting: Surgery

## 2024-10-07 ENCOUNTER — Encounter (HOSPITAL_COMMUNITY)
Admission: RE | Disposition: A | Discharge status: Home / Self Care | Payer: Self-pay | Source: Ambulatory Visit | Attending: Surgery

## 2024-10-07 ENCOUNTER — Ambulatory Visit (HOSPITAL_COMMUNITY): Payer: Self-pay | Admitting: Physician Assistant

## 2024-10-07 ENCOUNTER — Other Ambulatory Visit: Payer: Self-pay

## 2024-10-07 DIAGNOSIS — K432 Incisional hernia without obstruction or gangrene: Secondary | ICD-10-CM | POA: Diagnosis not present

## 2024-10-07 DIAGNOSIS — D649 Anemia, unspecified: Secondary | ICD-10-CM | POA: Diagnosis not present

## 2024-10-07 DIAGNOSIS — M1990 Unspecified osteoarthritis, unspecified site: Secondary | ICD-10-CM | POA: Diagnosis not present

## 2024-10-07 DIAGNOSIS — K449 Diaphragmatic hernia without obstruction or gangrene: Secondary | ICD-10-CM | POA: Insufficient documentation

## 2024-10-07 DIAGNOSIS — Z90710 Acquired absence of both cervix and uterus: Secondary | ICD-10-CM | POA: Insufficient documentation

## 2024-10-07 DIAGNOSIS — K439 Ventral hernia without obstruction or gangrene: Secondary | ICD-10-CM | POA: Diagnosis present

## 2024-10-07 DIAGNOSIS — K219 Gastro-esophageal reflux disease without esophagitis: Secondary | ICD-10-CM | POA: Diagnosis not present

## 2024-10-07 DIAGNOSIS — Z9889 Other specified postprocedural states: Secondary | ICD-10-CM | POA: Diagnosis not present

## 2024-10-07 HISTORY — PX: XI ROBOTIC ASSISTED VENTRAL HERNIA: SHX6789

## 2024-10-07 LAB — CBC
HCT: 37.2 % (ref 36.0–46.0)
Hemoglobin: 12.3 g/dL (ref 12.0–15.0)
MCH: 31 pg (ref 26.0–34.0)
MCHC: 33.1 g/dL (ref 30.0–36.0)
MCV: 93.7 fL (ref 80.0–100.0)
Platelets: 220 K/uL (ref 150–400)
RBC: 3.97 MIL/uL (ref 3.87–5.11)
RDW: 12.9 % (ref 11.5–15.5)
WBC: 11.9 K/uL — ABNORMAL HIGH (ref 4.0–10.5)
nRBC: 0 % (ref 0.0–0.2)

## 2024-10-07 LAB — CREATININE, SERUM
Creatinine, Ser: 0.88 mg/dL (ref 0.44–1.00)
GFR, Estimated: >60 mL/min (ref >60)

## 2024-10-07 SURGERY — REPAIR, HERNIA, VENTRAL, ROBOT-ASSISTED
Anesthesia: General

## 2024-10-07 MED ORDER — CEFAZOLIN SODIUM-DEXTROSE 2-4 GM/100ML-% IV SOLN
2.0000 g | INTRAVENOUS | Status: AC
  Administered 2024-10-07: 2 g via INTRAVENOUS
  Filled 2024-10-07: qty 100

## 2024-10-07 MED ORDER — FAMOTIDINE 10 MG PO TABS: 10.0000 mg | ORAL_TABLET | Freq: Every day | ORAL | Status: DC | PRN

## 2024-10-07 MED ORDER — HYDROMORPHONE HCL 1 MG/ML IJ SOLN: 0.5000 mg | INTRAMUSCULAR | Status: DC | PRN

## 2024-10-07 MED ORDER — OXYCODONE HCL 5 MG PO TABS: 10.0000 mg | ORAL_TABLET | ORAL | Status: DC | PRN

## 2024-10-07 MED ORDER — OXYCODONE HCL 5 MG PO TABS: 5.0000 mg | ORAL_TABLET | ORAL | Status: DC | PRN

## 2024-10-07 MED ORDER — CHLORHEXIDINE GLUCONATE CLOTH 2 % EX PADS: 6.0000 | MEDICATED_PAD | Freq: Once | CUTANEOUS | Status: DC

## 2024-10-07 MED ORDER — FENTANYL CITRATE (PF) 100 MCG/2ML IJ SOLN
INTRAMUSCULAR | Status: DC | PRN
  Administered 2024-10-07 (×3): 50 ug via INTRAVENOUS

## 2024-10-07 MED ORDER — BUPIVACAINE-EPINEPHRINE (PF) 0.25% -1:200000 IJ SOLN
INTRAMUSCULAR | Status: AC
  Filled 2024-10-07: qty 30

## 2024-10-07 MED ORDER — PROPOFOL 10 MG/ML IV BOLUS
INTRAVENOUS | Status: DC | PRN
  Administered 2024-10-07: 120 mg via INTRAVENOUS

## 2024-10-07 MED ORDER — MECLIZINE HCL 25 MG PO TABS: 25.0000 mg | ORAL_TABLET | Freq: Three times a day (TID) | ORAL | Status: DC | PRN

## 2024-10-07 MED ORDER — DEXAMETHASONE SOD PHOSPHATE PF 10 MG/ML IJ SOLN
INTRAMUSCULAR | Status: DC | PRN
  Administered 2024-10-07: 8 mg via INTRAVENOUS

## 2024-10-07 MED ORDER — LACTATED RINGERS IV SOLN: INTRAVENOUS | Status: DC

## 2024-10-07 MED ORDER — ORAL CARE MOUTH RINSE: 15.0000 mL | Freq: Once | OROMUCOSAL | Status: AC

## 2024-10-07 MED ORDER — ONDANSETRON HCL 4 MG/2ML IJ SOLN
4.0000 mg | Freq: Four times a day (QID) | INTRAMUSCULAR | Status: DC | PRN
  Administered 2024-10-07: 4 mg via INTRAVENOUS
  Filled 2024-10-07: qty 2

## 2024-10-07 MED ORDER — GABAPENTIN 300 MG PO CAPS
300.0000 mg | ORAL_CAPSULE | Freq: Three times a day (TID) | ORAL | Status: DC
  Administered 2024-10-07 – 2024-10-08 (×2): 300 mg via ORAL
  Filled 2024-10-07: qty 1
  Filled 2024-10-07 (×2): qty 3

## 2024-10-07 MED ORDER — BUPIVACAINE-EPINEPHRINE 0.25% -1:200000 IJ SOLN
INTRAMUSCULAR | Status: DC | PRN
  Administered 2024-10-07: 30 mL

## 2024-10-07 MED ORDER — PROPOFOL 10 MG/ML IV BOLUS
INTRAVENOUS | Status: AC
  Filled 2024-10-07: qty 20

## 2024-10-07 MED ORDER — 0.9 % SODIUM CHLORIDE (POUR BTL) OPTIME
TOPICAL | Status: DC | PRN
  Administered 2024-10-07: 1000 mL

## 2024-10-07 MED ORDER — ONDANSETRON HCL 4 MG/2ML IJ SOLN
INTRAMUSCULAR | Status: DC | PRN
  Administered 2024-10-07: 4 mg via INTRAVENOUS

## 2024-10-07 MED ORDER — ROCURONIUM BROMIDE 100 MG/10ML IV SOLN
INTRAVENOUS | Status: DC | PRN
  Administered 2024-10-07: 6 mg via INTRAVENOUS

## 2024-10-07 MED ORDER — PROCHLORPERAZINE EDISYLATE 10 MG/2ML IJ SOLN
10.0000 mg | INTRAMUSCULAR | Status: DC | PRN
  Administered 2024-10-07: 10 mg via INTRAVENOUS
  Filled 2024-10-07: qty 2

## 2024-10-07 MED ORDER — ACETAMINOPHEN 500 MG PO TABS
1000.0000 mg | ORAL_TABLET | ORAL | Status: AC
  Administered 2024-10-07: 1000 mg via ORAL
  Filled 2024-10-07: qty 2

## 2024-10-07 MED ORDER — PHENYLEPHRINE HCL-NACL 20-0.9 MG/250ML-% IV SOLN
INTRAVENOUS | Status: DC | PRN
  Administered 2024-10-07: 50 ug/min via INTRAVENOUS

## 2024-10-07 MED ORDER — FENTANYL CITRATE (PF) 50 MCG/ML IJ SOSY
PREFILLED_SYRINGE | INTRAMUSCULAR | Status: AC
  Filled 2024-10-07: qty 1

## 2024-10-07 MED ORDER — LIDOCAINE HCL (PF) 2 % IJ SOLN
INTRAMUSCULAR | Status: AC
  Filled 2024-10-07: qty 5

## 2024-10-07 MED ORDER — KETOROLAC TROMETHAMINE 15 MG/ML IJ SOLN
15.0000 mg | Freq: Three times a day (TID) | INTRAMUSCULAR | Status: DC
Start: 2024-10-07 — End: 2024-10-12
  Administered 2024-10-07 – 2024-10-08 (×3): 15 mg via INTRAVENOUS
  Filled 2024-10-07 (×3): qty 1

## 2024-10-07 MED ORDER — ACETAMINOPHEN 325 MG PO TABS
650.0000 mg | ORAL_TABLET | Freq: Four times a day (QID) | ORAL | Status: DC
  Administered 2024-10-07 – 2024-10-08 (×2): 650 mg via ORAL
  Filled 2024-10-07 (×2): qty 2

## 2024-10-07 MED ORDER — LIDOCAINE HCL (CARDIAC) PF 100 MG/5ML IV SOSY
PREFILLED_SYRINGE | INTRAVENOUS | Status: DC | PRN
  Administered 2024-10-07: 60 mg via INTRAVENOUS

## 2024-10-07 MED ORDER — LACTATED RINGERS IV SOLN
INTRAVENOUS | Status: DC | PRN
Start: 2024-10-07 — End: 2024-10-07

## 2024-10-07 MED ORDER — SUGAMMADEX SODIUM 200 MG/2ML IV SOLN
INTRAVENOUS | Status: DC | PRN
  Administered 2024-10-07: 200 mg via INTRAVENOUS

## 2024-10-07 MED ORDER — ENOXAPARIN SODIUM 40 MG/0.4ML IJ SOSY
40.0000 mg | PREFILLED_SYRINGE | INTRAMUSCULAR | Status: DC
  Administered 2024-10-08: 40 mg via SUBCUTANEOUS
  Filled 2024-10-07: qty 0.4

## 2024-10-07 MED ORDER — GABAPENTIN 300 MG PO CAPS
300.0000 mg | ORAL_CAPSULE | ORAL | Status: AC
  Administered 2024-10-07: 300 mg via ORAL
  Filled 2024-10-07: qty 1

## 2024-10-07 MED ORDER — ATORVASTATIN CALCIUM 10 MG PO TABS
10.0000 mg | ORAL_TABLET | Freq: Every day | ORAL | Status: DC
Start: 2024-10-08 — End: 2024-10-08
  Administered 2024-10-08: 10 mg via ORAL
  Filled 2024-10-07: qty 1

## 2024-10-07 MED ORDER — ACETAMINOPHEN 500 MG PO TABS: 1000.0000 mg | ORAL_TABLET | Freq: Once | ORAL | Status: AC

## 2024-10-07 MED ORDER — METHOCARBAMOL 1000 MG/10ML IJ SOLN: 500.0000 mg | Freq: Four times a day (QID) | INTRAMUSCULAR | Status: DC | PRN

## 2024-10-07 MED ORDER — FENTANYL CITRATE (PF) 100 MCG/2ML IJ SOLN
INTRAMUSCULAR | Status: AC
  Filled 2024-10-07: qty 2

## 2024-10-07 MED ORDER — PANTOPRAZOLE SODIUM 40 MG PO TBEC
40.0000 mg | DELAYED_RELEASE_TABLET | Freq: Every day | ORAL | Status: DC
  Administered 2024-10-07 – 2024-10-08 (×2): 40 mg via ORAL
  Filled 2024-10-07 (×2): qty 1

## 2024-10-07 MED ORDER — CHLORHEXIDINE GLUCONATE 0.12 % MT SOLN
15.0000 mL | Freq: Once | OROMUCOSAL | Status: AC
  Administered 2024-10-07: 15 mL via OROMUCOSAL

## 2024-10-07 MED ORDER — CLONAZEPAM 0.5 MG PO TABS: 0.5000 mg | ORAL_TABLET | Freq: Every day | ORAL | Status: DC | PRN

## 2024-10-07 MED ORDER — SUGAMMADEX SODIUM 200 MG/2ML IV SOLN
INTRAVENOUS | Status: AC
Start: 2024-10-07 — End: 2024-10-07
  Filled 2024-10-07: qty 2

## 2024-10-07 MED ORDER — ROCURONIUM BROMIDE 10 MG/ML (PF) SYRINGE
PREFILLED_SYRINGE | INTRAVENOUS | Status: AC
  Filled 2024-10-07: qty 10

## 2024-10-07 MED ORDER — EPHEDRINE SULFATE (PRESSORS) 25 MG/5ML IV SOSY
PREFILLED_SYRINGE | INTRAVENOUS | Status: DC | PRN
  Administered 2024-10-07 (×2): 10 mg via INTRAVENOUS

## 2024-10-07 MED ORDER — PHENYLEPHRINE 80 MCG/ML (10ML) SYRINGE FOR IV PUSH (FOR BLOOD PRESSURE SUPPORT)
PREFILLED_SYRINGE | INTRAVENOUS | Status: DC | PRN
  Administered 2024-10-07: 160 ug via INTRAVENOUS
  Administered 2024-10-07 (×2): 80 ug via INTRAVENOUS

## 2024-10-07 MED ORDER — DOCUSATE SODIUM 100 MG PO CAPS
100.0000 mg | ORAL_CAPSULE | Freq: Two times a day (BID) | ORAL | Status: DC
Start: 2024-10-07 — End: 2024-10-08
  Administered 2024-10-07 – 2024-10-08 (×2): 100 mg via ORAL
  Filled 2024-10-07 (×2): qty 1

## 2024-10-07 MED ORDER — SIMETHICONE 80 MG PO CHEW: 80.0000 mg | CHEWABLE_TABLET | Freq: Four times a day (QID) | ORAL | Status: DC | PRN

## 2024-10-07 MED ORDER — ONDANSETRON HCL 4 MG/2ML IJ SOLN
INTRAMUSCULAR | Status: AC
Start: 2024-10-07 — End: 2024-10-07
  Filled 2024-10-07: qty 2

## 2024-10-07 MED ORDER — FENTANYL CITRATE (PF) 50 MCG/ML IJ SOSY
25.0000 ug | PREFILLED_SYRINGE | INTRAMUSCULAR | Status: DC | PRN
  Administered 2024-10-07 (×2): 50 ug via INTRAVENOUS

## 2024-10-07 MED ORDER — DROPERIDOL 2.5 MG/ML IJ SOLN: 0.6250 mg | Freq: Once | INTRAMUSCULAR | Status: DC | PRN

## 2024-10-07 SURGICAL SUPPLY — 54 items
BAG COUNTER SPONGE SURGICOUNT (BAG) ×1 IMPLANT
BLADE SURG SZ11 CARB STEEL (BLADE) ×1 IMPLANT
CHLORAPREP W/TINT 26 (MISCELLANEOUS) ×1 IMPLANT
COVER MAYO STAND STRL (DRAPES) ×1 IMPLANT
COVER SURGICAL LIGHT HANDLE (MISCELLANEOUS) ×1 IMPLANT
COVER TIP SHEARS 8 DVNC (MISCELLANEOUS) ×1 IMPLANT
DERMABOND ADVANCED .7 DNX12 (GAUZE/BANDAGES/DRESSINGS) IMPLANT
DERMABOND ADVANCED .7 DNX6 (GAUZE/BANDAGES/DRESSINGS) IMPLANT
DEVICE TROCAR PUNCTURE CLOSURE (ENDOMECHANICALS) IMPLANT
DRAPE ARM DVNC X/XI (DISPOSABLE) ×3 IMPLANT
DRAPE COLUMN DVNC XI (DISPOSABLE) ×1 IMPLANT
DRAPE SHEET LG 3/4 BI-LAMINATE (DRAPES) IMPLANT
DRIVER NDL LRG 8 DVNC XI (INSTRUMENTS) ×2 IMPLANT
DRIVER NDL MEGA SUTCUT DVNCXI (INSTRUMENTS) ×2 IMPLANT
DRIVER NDLE LRG 8 DVNC XI (INSTRUMENTS) ×2 IMPLANT
DRIVER NDLE MEGA SUTCUT DVNCXI (INSTRUMENTS) ×2 IMPLANT
ELECT PENCIL ROCKER SW 15FT (MISCELLANEOUS) ×1 IMPLANT
ELECT REM PT RETURN 15FT ADLT (MISCELLANEOUS) ×1 IMPLANT
ELECTRODE L-HOOK LAP 45CM DISP (ELECTROSURGICAL) ×1 IMPLANT
FORCEPS PROGRASP DVNC XI (FORCEP) ×1 IMPLANT
GLOVE BIO SURGEON STRL SZ7.5 (GLOVE) ×2 IMPLANT
GLOVE INDICATOR 8.0 STRL GRN (GLOVE) ×2 IMPLANT
GOWN STRL REUS W/ TWL XL LVL3 (GOWN DISPOSABLE) ×2 IMPLANT
GRASPER SUT TROCAR 14GX15 (MISCELLANEOUS) IMPLANT
GRASPER TIP-UP FEN DVNC XI (INSTRUMENTS) ×1 IMPLANT
IRRIGATION SUCT STRKRFLW 2 WTP (MISCELLANEOUS) IMPLANT
KIT BASIN OR (CUSTOM PROCEDURE TRAY) ×1 IMPLANT
KIT TURNOVER KIT A (KITS) ×1 IMPLANT
MANIFOLD NEPTUNE II (INSTRUMENTS) ×1 IMPLANT
MARKER SKIN DUAL TIP RULER LAB (MISCELLANEOUS) ×1 IMPLANT
MESH SOFT 12X12IN BARD (Mesh General) IMPLANT
NDL SPNL 18GX3.5 QUINCKE PK (NEEDLE) ×1 IMPLANT
NEEDLE SPNL 18GX3.5 QUINCKE PK (NEEDLE) ×1 IMPLANT
OBTURATOR OPTICALSTD 8 DVNC (TROCAR) ×1 IMPLANT
PACK CARDIOVASCULAR III (CUSTOM PROCEDURE TRAY) ×1 IMPLANT
SCISSORS MNPLR CVD DVNC XI (INSTRUMENTS) ×1 IMPLANT
SEAL UNIV 5-12 XI (MISCELLANEOUS) ×3 IMPLANT
SET TUBE SMOKE EVAC HIGH FLOW (TUBING) ×1 IMPLANT
SOLUTION ANTFG W/FOAM PAD STRL (MISCELLANEOUS) ×1 IMPLANT
SOLUTION ELECTROSURG ANTI STCK (MISCELLANEOUS) ×1 IMPLANT
SPIKE FLUID TRANSFER (MISCELLANEOUS) ×1 IMPLANT
SPONGE T-LAP 18X18 ~~LOC~~+RFID (SPONGE) ×1 IMPLANT
SUT MNCRL AB 4-0 PS2 18 (SUTURE) ×1 IMPLANT
SUT STRAFIX PDS 18 CTX (SUTURE) IMPLANT
SUT STRATA PDS 0 15 CT-1.5 (SUTURE) IMPLANT
SUT VLOC 180 0 9IN GS21 (SUTURE) IMPLANT
SUTURE STRAFIX SYMMETRC 1-0 12 (SUTURE) IMPLANT
SUTURE STRAFIX SYMMETRC 1-0 24 (SUTURE) IMPLANT
SUTURE STRTFX SPRL PDS+ 2-0 23 (SUTURE) IMPLANT
SYR 20ML LL LF (SYRINGE) ×1 IMPLANT
TAPE STRIPS DRAPE STRL (GAUZE/BANDAGES/DRESSINGS) ×1 IMPLANT
TOWEL OR 17X26 10 PK STRL BLUE (TOWEL DISPOSABLE) ×1 IMPLANT
TROCAR ADV FIXATION 12X100MM (TROCAR) ×1 IMPLANT
TROCAR Z-THREAD FIOS 5X100MM (TROCAR) ×1 IMPLANT

## 2024-10-07 NOTE — Anesthesia Procedure Notes (Signed)
 Procedure Name: Intubation Date/Time: 10/07/2024 1:55 PM  Performed by: Deeann Eva BROCKS, CRNAPre-anesthesia Checklist: Patient identified, Emergency Drugs available, Suction available and Patient being monitored Patient Re-evaluated:Patient Re-evaluated prior to induction Oxygen Delivery Method: Circle System Utilized Preoxygenation: Pre-oxygenation with 100% oxygen Induction Type: IV induction Ventilation: Mask ventilation without difficulty Laryngoscope Size: Mac and 3 Grade View: Grade II Tube type: Oral Number of attempts: 1 Airway Equipment and Method: Stylet Placement Confirmation: ETT inserted through vocal cords under direct vision, positive ETCO2 and breath sounds checked- equal and bilateral Secured at: 21 cm Tube secured with: Tape Dental Injury: Teeth and Oropharynx as per pre-operative assessment

## 2024-10-07 NOTE — H&P (Signed)
 Admitting Physician: Deward PARAS Haileigh Pitz  Service: General surgery  CC: hernia  Subjective   HPI: Beth Suarez is an 78 y.o. female who is here for hernia repair.    Beth Suarez has a hernia superior and to the right of her umbilicus. Has been getting significantly larger recently. 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 Her husband is currently in rehab. She was hopeful that she would be able get this hernia fixed while her husband is in rehab. It has not been causing much in the way of symptoms. It does not hurt, she has not had any bowel or obstructive issues. Though she is concerned that is getting larger pretty quickly. It has been present for about 2 years. She has a previous Nissen fundoplication with Dr. Gladis, and previous GYN surgery. The hernia appears to be underlying one of the Nissen port sites.     Past Medical History:  Diagnosis Date   Acute ischemic colitis 05/2011   [facility]   Adenomatous polyps    Colonoscopy 03/2008   Anemia    iron deficiency   Arthritis    GERD (gastroesophageal reflux disease)    Not taking Medications   Hiatal hernia    Hypercholesteremia    Internal and external hemorrhoids without complication    Liver cyst    Migraine headache    Restless leg syndrome    more left leg involved   Shortness of breath dyspnea    due to low iron - wtih exertion    Vertigo     Past Surgical History:  Procedure Laterality Date   ABDOMINAL HYSTERECTOMY     CATARACT EXTRACTION W/ INTRAOCULAR LENS IMPLANT Bilateral    COLONOSCOPY W/ BIOPSIES  03/2008, 05/2011   Adenomous polyps, internal and external hemorrhoids, colitis   COLONOSCOPY WITH PROPOFOL  N/A 06/16/2015   Procedure: COLONOSCOPY WITH PROPOFOL ;  Surgeon: Lupita FORBES Commander, MD;  Location: [facility];  Service: Endoscopy;  Laterality: N/A;   ESOPHAGOGASTRODUODENOSCOPY (EGD) WITH PROPOFOL  N/A 06/16/2015   Procedure: ESOPHAGOGASTRODUODENOSCOPY (EGD) WITH PROPOFOL ;   Surgeon: Lupita FORBES Commander, MD;  Location: [facility];  Service: Endoscopy;  Laterality: N/A;   KNEE ARTHROSCOPY Left 12/12/1998   LAPAROSCOPIC NISSEN FUNDOPLICATION N/A 09/22/2015   Procedure: LAPAROSCOPIC NISSEN FUNDOPLICATION AND REPAIR OF HIATUS HERNIA;  Surgeon: Donnice Gladis, MD;  Location: [facility];  Service: General;  Laterality: N/A;  With MESH   SEBACEOUS CYST Removeal Right     face   TOTAL ABDOMINAL HYSTERECTOMY W/ BILATERAL SALPINGOOPHORECTOMY  12/13/1991    Family History  Problem Relation Age of Onset   Diabetes Mother    Kidney disease Mother    Heart attack Father 84   Heart disease Father    Irritable bowel syndrome Daughter     Social:  reports that she has never smoked. She has never used smokeless tobacco. She reports that she does not drink alcohol  and does not use drugs.  Allergies:  Allergies  Allergen Reactions   Codeine     Headache    Medications: Current Outpatient Medications  Medication Instructions   atorvastatin (LIPITOR) 10 mg, Every morning   Calcium-Vitamin D 600-125 MG-UNIT TABS 1 tablet, Daily   cholecalciferol (VITAMIN D3) 1,000 Units, Daily   clonazePAM (KLONOPIN) 0.5 MG tablet 1 tablet, Daily PRN   esomeprazole (NEXIUM) 40 mg, Oral, Daily   famotidine (PEPCID) 10 mg, Daily PRN   Ferrous Sulfate (IRON PO) 30 mg, Daily   Fish Oil 1,200  mg, Daily   ibuprofen (ADVIL) 200 mg, Every 6 hours PRN   meclizine (ANTIVERT) 25 mg, Oral, 3 times daily PRN   Multiple Vitamin (MULTIVITAMIN) tablet 1 tablet, Daily   Vitamin B-12 5,000 mcg, Daily    ROS - all of the below systems have been reviewed with the patient and positives are indicated with bold text General: chills, fever or night sweats Eyes: blurry vision or double vision ENT: epistaxis or sore throat Allergy/Immunology: itchy/watery eyes or nasal congestion Hematologic/Lymphatic: bleeding problems, blood clots or swollen lymph nodes Endocrine: temperature intolerance or unexpected  weight changes Breast: new or changing breast lumps or nipple discharge Resp: cough, shortness of breath, or wheezing CV: chest pain or dyspnea on exertion GI: as per HPI GU: dysuria, trouble voiding, or hematuria MSK: joint pain or joint stiffness Neuro: TIA or stroke symptoms Derm: pruritus and skin lesion changes Psych: anxiety and depression  Objective   PE Blood pressure (!) 149/90, pulse 69, temperature 97.8 F (36.6 C), temperature source Oral, resp. rate 18, SpO2 96%. Constitutional: NAD; conversant; no deformities Eyes: Moist conjunctiva; no lid lag; anicteric; PERRL Neck: Trachea midline; no thyromegaly Lungs: Normal respiratory effort; no tactile fremitus CV: RRR; no palpable thrills; no pitting edema GI: Abd Supraumbilical hernai; no palpable hepatosplenomegaly MSK: Normal range of motion of extremities; no clubbing/cyanosis Psychiatric: Appropriate affect; alert and oriented x3 Lymphatic: No palpable cervical or axillary lymphadenopathy  No results found for this or any previous visit (from the past 24 hours).  Imaging Orders  No imaging studies ordered today    CT Abd/Pel 09/06/24  2.7 x 5.9 cm tall hernia area involving supraumbilical and umbilical defects   Assessment and Plan   Beth Suarez is an 78 y.o. female with a 2.7 x 5.8 cm incisional hernia. I recommended robotic incisional hernia repair with mesh, bilateral posterior rectus myofascial release, and possible bilateral transversus abdominis myofascial release. We discussed the surgery itself as well as its risk, benefits, and alternatives and she granted consent to proceed.   Deward JINNY Foy, MD  [facility] Use [email or web address] to contact on call provider

## 2024-10-07 NOTE — Transfer of Care (Signed)
 Immediate Anesthesia Transfer of Care Note  Patient: Beth Suarez  Procedure(s) Performed: REPAIR WITH MESH, HERNIA, VENTRAL, ROBOT-ASSISTED. BILATERAL POSTERIOR RECTUS MYOFASCIAL RELEASE  Patient Location: PACU  Anesthesia Type:General  Level of Consciousness: awake and alert   Airway & Oxygen Therapy: Patient Spontanous Breathing and Patient connected to face mask oxygen  Post-op Assessment: Report given to RN and Post -op Vital signs reviewed and stable  Post vital signs: Reviewed and stable  Last Vitals:  Vitals Value Taken Time  BP 126/77 10/07/24 16:00  Temp    Pulse 92 10/07/24 16:02  Resp 11 10/07/24 16:02  SpO2 99 % 10/07/24 16:02  Vitals shown include unfiled device data.  Last Pain:  Vitals:   10/07/24 1600  TempSrc:   PainSc: 0-No pain      Patients Stated Pain Goal: 4 (10/07/24 1251)  Complications: No notable events documented.

## 2024-10-07 NOTE — Anesthesia Preprocedure Evaluation (Signed)
 Anesthesia Evaluation  Patient identified by MRN, date of birth, ID band Patient awake    Reviewed: Allergy & Precautions, NPO status , Patient's Chart, lab work & pertinent test results  Airway Mallampati: II  TM Distance: >3 FB Neck ROM: Full    Dental no notable dental hx.    Pulmonary neg pulmonary ROS   Pulmonary exam normal        Cardiovascular negative cardio ROS  Rhythm:Regular Rate:Normal     Neuro/Psych  Headaches  negative psych ROS   GI/Hepatic Neg liver ROS, hiatal hernia,GERD  Medicated,,Incisional hernia    Endo/Other  negative endocrine ROS    Renal/GU negative Renal ROS  negative genitourinary   Musculoskeletal  (+) Arthritis , Osteoarthritis,    Abdominal Normal abdominal exam  (+)   Peds  Hematology  (+) Blood dyscrasia, anemia   Anesthesia Other Findings   Reproductive/Obstetrics                              Anesthesia Physical Anesthesia Plan  ASA: 2  Anesthesia Plan: General   Post-op Pain Management: Tylenol  PO (pre-op)*   Induction: Intravenous  PONV Risk Score and Plan: 3 and Ondansetron , Dexamethasone  and Treatment may vary due to age or medical condition  Airway Management Planned: Mask and Oral ETT  Additional Equipment: None  Intra-op Plan:   Post-operative Plan: Extubation in OR  Informed Consent: I have reviewed the patients History and Physical, chart, labs and discussed the procedure including the risks, benefits and alternatives for the proposed anesthesia with the patient or authorized representative who has indicated his/her understanding and acceptance.     Dental advisory given  Plan Discussed with: CRNA  Anesthesia Plan Comments:         Anesthesia Quick Evaluation

## 2024-10-07 NOTE — Op Note (Signed)
 Patient: Beth Suarez (1946/06/15, [id number])  Date of Surgery: 10/07/2024  Preoperative Diagnosis: INCISIONAL HERNIA (2.7 cm wide x 5.9 cm tall hernia area involving supraumbilical and umbilical defects on preoperative CT)  Postoperative Diagnosis: INCISIONAL HERNIA (2.7 cm wide x 5.9 cm tall hernia area involving supraumbilical and umbilical defects on preoperative CT)  Surgical Procedure: Robotic ventral hernia repair with mesh (2.7 cm wide x 5.9 cm tall hernia area involving supraumbilical and umbilical defects on preoperative CT) Bilateral posterior rectus myofascial release    Operative Team Members:  Surgeons and Role:    * Kerianne Gurr, Deward PARAS, MD - Primary   Anesthesiologist: Dorethea [name], DO CRNA: Deeann Eva BROCKS, CRNA   Anesthesia: General   Fluids:  No intake/output data recorded.  Complications: * No complications entered in OR log *  Drains:  None  Specimen: * No specimens in log *   Disposition:  PACU - hemodynamically stable.  Plan of Care: Admit for overnight observation  Indications for Procedure: Beth Suarez is a 78 y.o. female who presented with a ventral hernia.    I recommended robotic ventral hernia repair with mesh.  The procedure itself as well as the risks, benefits and alternatives were described.  The risks discussed included but were not limited to the risk of infection, bleeding, damage to nearby structures, recurrent hernia, chronic pain, and mesh complication requiring removal.  After a full discussion and all questions answered, the patient granted consent to proceed.  Findings:  Hernia Location: Ventral hernia location: Epigastric (M2) and Umbilical (M3) Hernia Size:  2.7 cm wide x 5.9 cm tall hernia area involving supraumbilical and umbilical defects on preoperative CT Mesh Size &Type:  30 cm tall x 15 cm wide Bard Soft Mesh Mesh Position: Sublay - Retromuscular Myofascial Releases: Bilateral posterior rectus myofascial  release  Description of Procedure: The patient was positioned supine, moderately flexed at the umbilical level, padded and secured on the operating table.  A timeout procedure was performed.    What is described is a robotic, totally extraperitoneal retromuscular incisional hernia repair with bilateral rectus myofascial release and retromuscular mesh placement.  Laparoscopic Portion: The retrorectus space was entered in the LEFT hypochondrium, at approximately the midclavicular line utilizing a 5 mm optical-viewing trocar.  Upon safe entry into this space, it was insufflated while performing a blunt dissection with the camera still in the optical trocar.   A rectus myofascial release was initiated laparoscopically on the LEFT side. Dissection was carried out laterally in the retromuscular plane to the edge of the rectus sheath progressively disconnecting the rectus muscle from the underlying posterior rectus sheath. Both the segmental innervation as well as the intercostal artery and vein brances to the rectus muscle were individually preserved.    During the left sided retrorectus dissection, a 12 mm trocar was placed into the lateral most edge of the retrorectus space.  With these initial trocars in position, the medial most aspect of the retrorectus plane was identified, and the posterior sheath was visualized as it inserted on the linea alba. The posterior sheath was incised with cautery entering the preperitoneal plane. A crossover was performed dissecting under the linea alba in the preperitoneal plane until the right rectus sheath was identified.  After identification of the right rectus sheath, it was incised vertically to enter the retrorectus space on the right.   A rectus myofascial release was initiated laparoscopically on the RIGHT side.  Blunt dissection was carried out laterally in the  retromuscular plane to the edge of the rectus sheath progressively disconnecting the rectus muscle  from the underlying posterior rectus sheath. Both the segmental innervation as well as the intercostal artery and vein brances to the rectus muscle were individually preserved.   At this juncture, both retrorectus planes were initially connected to each other and there was space for further trocar placement. An 8 mm robotic trocar was placed in the midclavicular line in right retrorectus space.  A 8mm robotic trocar was placed within the left rectus musculature in the upper abdomen, and not through the linea alba.  The initial 5 mm access trocar in the midclavicular line within the left retrorectus space was switched out for an 8 mm robotic trocar.   Robotic Portion: The Intuitive daVinci Xi surgical robot was docked in the standard fashion and the procedure begun from the robotic console. A Prograsp instrument and monopolar shears were used for the dissection.  The rectus myofascial release was completed robotically on the RIGHT side.  Dissection was carried down inferiorly preserving the peritoneum and the preperitoneal fat in the midline as it was gently dissected off of the overlying linea alba.  On the right side, the posterior rectus sheath was progressively disconnected from its insertion on the linea alba. This allowed for progression of the right side rectus myofascial release.  The rectus myofascial release accomplished medialization of the posterior rectus sheath towards the midline and disinsertion of the rectus muscle from its surrounding fascia, and thus its encasement in the rectus sheath, allowing for widening of the rectus muscle and transfer of the rectus flap towards the midline.  This will allow for future inset of the medial aspect of the flap for abdominal wall reconstruction.  Similarly, the rectus myofascial release was completed robotically on the LEFT side.  Dissection of the posterior rectus sheath was also progressively disconnected from its insertion on the linea alba.  This  allowed for progression of the left side rectus myofascial release.  The rectus myofascial release accomplished medialization of the posterior rectus sheath towards the midline and disinsertion of the rectus muscle from its surrounding fascia, and thus its encasement in the rectus sheath, allowing for widening of the rectus muscle and transfer of the rectus flap towards the midline.  This will allow for future inset of the medial aspect of the flap for abdominal wall reconstruction.  During the dissection of the midline the hernia defect was identified and the hernia sac was not reducible, therefore the hernia peritoneum was incised circumferentially around the edge of the hernia defect which left a defect within the peritoneum in the midline.  This defect was later closed with a running 2-0 Ethicon Stratafix Spiral PDS suture.  Both the left and the right rectus myofascial releases were performed towards the lower abdomen, past the arcuate line bilaterally.  During this dissection, the peritoneum and preperitoneal fat in the midline were further preserved below the hernia as they were dissected off of the overlying linea alba.   The hernia defect area was now visualized fully.  Tension on the peritoneal and posterior rectus fascia closure was assessed.  The goal was for a tension free closure to avoid postoperative intraparietal hernia, and sufficient retromuscular mesh overlap.  To acchieve these goals, I decided bilateral transversus abdominis release was not necessary.    The hernia defect was closed utilizing a continuous, #1 Ethicon Stratafix Symmetric PDS Plus suture.  The hernia defect, and subsequently the rectus musculature, came together well for  a complete abdominal wall reconstruction.  The dissected out retrorectus space was measured with a metric ruler so as to determine the size of the proposed mesh.    The robot was undocked and the laparoscope was inserted, inspecting for hemostasis.  The  mesh deployment was performed laparoscopically.  Laparoscopic Portion:  A transversus abdominis plane (TAP) block was performed bilaterally with a mixture of marcaine  and Exparel .  The anesthetic was first injected into the plane between the transversus abdominis and internal abdominal oblique muscles on the left. The TAP was repeated on the contralateral side.   A piece of Bard Soft mesh was opened and trimmed to 15 cm wide x 30 cm tall. The mesh was advanced into the retrorectus space and the mesh positioned flat against the intact posterior rectus sheaths. The mesh was not fixated as it occupied the entire retromuscular plane, and also covered all of the trocars.  The trocars were removed and the skin closed with 4-0 Monocryl subcuticular sutures and skin glue.   Deward Foy, MD General, Bariatric, & Minimally Invasive Surgery [facility], [place]

## 2024-10-07 NOTE — Anesthesia Postprocedure Evaluation (Signed)
 Anesthesia Post Note  Patient: Beth Suarez  Procedure(s) Performed: REPAIR WITH MESH, HERNIA, VENTRAL, ROBOT-ASSISTED. BILATERAL POSTERIOR RECTUS MYOFASCIAL RELEASE     Patient location during evaluation: PACU Anesthesia Type: General Level of consciousness: awake and alert Pain management: pain level controlled Vital Signs Assessment: post-procedure vital signs reviewed and stable Respiratory status: spontaneous breathing, nonlabored ventilation, respiratory function stable and patient connected to nasal cannula oxygen Cardiovascular status: blood pressure returned to baseline and stable Postop Assessment: no apparent nausea or vomiting Anesthetic complications: no   No notable events documented.  Last Vitals:  Vitals:   10/07/24 1700 10/07/24 1730  BP: 133/81 128/83  Pulse: 76 79  Resp: 17 18  Temp:  (!) 36.4 C  SpO2: 94% 98%    Last Pain:  Vitals:   10/07/24 1730  TempSrc: Oral  PainSc:                  [name] [name]

## 2024-10-08 ENCOUNTER — Encounter (HOSPITAL_COMMUNITY): Payer: Self-pay | Admitting: Surgery

## 2024-10-08 ENCOUNTER — Encounter (HOSPITAL_COMMUNITY)

## 2024-10-08 DIAGNOSIS — D649 Anemia, unspecified: Secondary | ICD-10-CM | POA: Diagnosis not present

## 2024-10-08 DIAGNOSIS — Z9889 Other specified postprocedural states: Secondary | ICD-10-CM | POA: Diagnosis not present

## 2024-10-08 DIAGNOSIS — K432 Incisional hernia without obstruction or gangrene: Secondary | ICD-10-CM | POA: Diagnosis not present

## 2024-10-08 DIAGNOSIS — M1990 Unspecified osteoarthritis, unspecified site: Secondary | ICD-10-CM | POA: Diagnosis not present

## 2024-10-08 DIAGNOSIS — K449 Diaphragmatic hernia without obstruction or gangrene: Secondary | ICD-10-CM | POA: Diagnosis not present

## 2024-10-08 DIAGNOSIS — K219 Gastro-esophageal reflux disease without esophagitis: Secondary | ICD-10-CM | POA: Diagnosis not present

## 2024-10-08 LAB — CBC
HCT: 35.7 % — ABNORMAL LOW (ref 36.0–46.0)
Hemoglobin: 11.5 g/dL — ABNORMAL LOW (ref 12.0–15.0)
MCH: 30.5 pg (ref 26.0–34.0)
MCHC: 32.2 g/dL (ref 30.0–36.0)
MCV: 94.7 fL (ref 80.0–100.0)
Platelets: 223 K/uL (ref 150–400)
RBC: 3.77 MIL/uL — ABNORMAL LOW (ref 3.87–5.11)
RDW: 13.2 % (ref 11.5–15.5)
WBC: 9.3 K/uL (ref 4.0–10.5)
nRBC: 0 % (ref 0.0–0.2)

## 2024-10-08 LAB — BASIC METABOLIC PANEL WITH GFR
Anion gap: 10 (ref 5–15)
BUN: 17 mg/dL (ref 8–23)
CO2: 24 mmol/L (ref 22–32)
Calcium: 9.2 mg/dL (ref 8.9–10.3)
Chloride: 104 mmol/L (ref 98–111)
Creatinine, Ser: 0.86 mg/dL (ref 0.44–1.00)
GFR, Estimated: >60 mL/min (ref >60)
Glucose, Bld: 137 mg/dL — ABNORMAL HIGH (ref 70–99)
Potassium: 4.1 mmol/L (ref 3.5–5.1)
Sodium: 139 mmol/L (ref 135–145)

## 2024-10-08 MED ORDER — OXYCODONE-ACETAMINOPHEN 5-325 MG PO TABS: 1.0000 | ORAL_TABLET | ORAL | 0 refills | Status: AC | PRN

## 2024-10-08 NOTE — Plan of Care (Signed)
 ?  Problem: Clinical Measurements: ?Goal: Will remain free from infection ?Outcome: Progressing ?  ?

## 2024-10-08 NOTE — Discharge Summary (Signed)
  Patient ID: Beth Suarez [id number] 77 y.o. [birth year]  10/07/2024  Discharge date and time: 10/08/2024  Admitting Physician: Deward PARAS Beth Suarez  Discharge Physician: Deward PARAS Ridhima Golberg  Admission Diagnoses: Ventral hernia [K43.9] Patient Active Problem List   Diagnosis Date Noted   Ventral hernia 10/07/2024   Anemia, iron deficiency 05/04/2015   Hiatal hernia - large and symptomatic 05/04/2015   Dysphagia 05/04/2015   History of colonic polyps 08/23/2011     Discharge Diagnoses:  Patient Active Problem List   Diagnosis Date Noted   Ventral hernia 10/07/2024   Anemia, iron deficiency 05/04/2015   Hiatal hernia - large and symptomatic 05/04/2015   Dysphagia 05/04/2015   History of colonic polyps 08/23/2011    Operations: Procedure(s): REPAIR WITH MESH, HERNIA, VENTRAL, ROBOT-ASSISTED. BILATERAL POSTERIOR RECTUS MYOFASCIAL RELEASE  Admission Condition: good  Discharged Condition: good  Indication for Admission: Ventral hernia  Hospital Course: Robotic ventral hernia repair  Consults: None  Significant Diagnostic Studies: none  Treatments: surgery: as above  Disposition: Home  Patient Instructions:  Allergies as of 10/08/2024       Reactions   Codeine    Headache        Medication List     TAKE these medications    atorvastatin 10 MG tablet Commonly known as: LIPITOR Take 10 mg by mouth in the morning.   Calcium-Vitamin D 600-125 MG-UNIT Tabs Take 1 tablet by mouth daily.   cholecalciferol 25 MCG (1000 UNIT) tablet Commonly known as: VITAMIN D3 Take 1,000 Units by mouth daily.   clonazePAM 0.5 MG tablet Commonly known as: KLONOPIN Take 1 tablet by mouth daily as needed (restless legs).   esomeprazole 40 MG capsule Commonly known as: NEXIUM Take 40 mg by mouth daily.   famotidine 10 MG tablet Commonly known as: PEPCID Take 10 mg by mouth daily as needed for heartburn or indigestion.   Fish Oil 1200 MG Caps Take 1,200 mg by  mouth daily.  360 mg Omega3   ibuprofen 200 MG tablet Commonly known as: ADVIL Take 200 mg by mouth every 6 (six) hours as needed for mild pain or moderate pain.   IRON PO Take 30 mg by mouth daily.   meclizine 25 MG tablet Commonly known as: ANTIVERT Take 25 mg by mouth 3 (three) times daily as needed for dizziness or nausea.   multivitamin tablet Take 1 tablet by mouth daily.   oxyCODONE -acetaminophen  5-325 MG tablet Commonly known as: Percocet Take 1 tablet by mouth every 4 (four) hours as needed for severe pain (pain score 7-10).   Vitamin B-12 5000 MCG Tbdp Take 5,000 mcg by mouth daily.        Activity: no heavy lifting for 4 weeks Diet: regular diet Wound Care: keep wound clean and dry  Follow-up:  With Dr. Lyndel  Signed: Deward PARAS Nagi Furio General, Bariatric, & Minimally Invasive Surgery [facility], [place]   10/08/2024, 11:09 AM

## 2024-10-08 NOTE — Discharge Instructions (Signed)

## 2024-11-14 DIAGNOSIS — Z4889 Encounter for other specified surgical aftercare: Secondary | ICD-10-CM | POA: Diagnosis not present
